# Patient Record
Sex: Female | Born: 1985
Health system: Southern US, Community
[De-identification: ages and names within clinical notes are randomized; demographics above are authoritative.]

## PROBLEM LIST (undated history)

## (undated) ENCOUNTER — Inpatient Hospital Stay (HOSPITAL_COMMUNITY): Payer: Self-pay

## (undated) DIAGNOSIS — N926 Irregular menstruation, unspecified: Secondary | ICD-10-CM

## (undated) DIAGNOSIS — D573 Sickle-cell trait: Secondary | ICD-10-CM

## (undated) DIAGNOSIS — J45909 Unspecified asthma, uncomplicated: Secondary | ICD-10-CM

## (undated) DIAGNOSIS — Z973 Presence of spectacles and contact lenses: Secondary | ICD-10-CM

## (undated) DIAGNOSIS — E611 Iron deficiency: Secondary | ICD-10-CM

## (undated) DIAGNOSIS — L309 Dermatitis, unspecified: Secondary | ICD-10-CM

## (undated) DIAGNOSIS — D649 Anemia, unspecified: Secondary | ICD-10-CM

## (undated) DIAGNOSIS — F419 Anxiety disorder, unspecified: Secondary | ICD-10-CM

## (undated) DIAGNOSIS — R002 Palpitations: Secondary | ICD-10-CM

## (undated) DIAGNOSIS — T7840XA Allergy, unspecified, initial encounter: Secondary | ICD-10-CM

## (undated) DIAGNOSIS — K5900 Constipation, unspecified: Secondary | ICD-10-CM

## (undated) DIAGNOSIS — J45998 Other asthma: Secondary | ICD-10-CM

## (undated) DIAGNOSIS — N62 Hypertrophy of breast: Secondary | ICD-10-CM

## (undated) HISTORY — DX: Dermatitis, unspecified: L30.9

## (undated) HISTORY — DX: Anemia, unspecified: D64.9

## (undated) HISTORY — PX: NO PAST SURGERIES: SHX2092

## (undated) HISTORY — DX: Allergy, unspecified, initial encounter: T78.40XA

## (undated) HISTORY — PX: BREAST SURGERY: SHX581

---
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
## 2012-05-24 DIAGNOSIS — N62 Hypertrophy of breast: Secondary | ICD-10-CM

## 2012-05-24 HISTORY — DX: Hypertrophy of breast: N62

## 2012-06-04 ENCOUNTER — Encounter (HOSPITAL_BASED_OUTPATIENT_CLINIC_OR_DEPARTMENT_OTHER): Payer: Self-pay | Admitting: *Deleted

## 2012-06-08 ENCOUNTER — Encounter (HOSPITAL_BASED_OUTPATIENT_CLINIC_OR_DEPARTMENT_OTHER): Payer: Self-pay | Admitting: Anesthesiology

## 2012-06-08 ENCOUNTER — Ambulatory Visit (HOSPITAL_BASED_OUTPATIENT_CLINIC_OR_DEPARTMENT_OTHER): Payer: BC Managed Care – PPO | Admitting: Anesthesiology

## 2012-06-08 ENCOUNTER — Encounter (HOSPITAL_BASED_OUTPATIENT_CLINIC_OR_DEPARTMENT_OTHER)
Admission: RE | Disposition: A | Discharge status: Home / Self Care | Payer: Self-pay | Source: Ambulatory Visit | Attending: Specialist

## 2012-06-08 ENCOUNTER — Ambulatory Visit (HOSPITAL_BASED_OUTPATIENT_CLINIC_OR_DEPARTMENT_OTHER)
Admission: RE | Admit: 2012-06-08 | Discharge: 2012-06-09 | Disposition: A | Discharge status: Home / Self Care | Payer: BC Managed Care – PPO | Source: Ambulatory Visit | Attending: Specialist | Admitting: Specialist

## 2012-06-08 ENCOUNTER — Encounter (HOSPITAL_BASED_OUTPATIENT_CLINIC_OR_DEPARTMENT_OTHER): Payer: Self-pay | Admitting: *Deleted

## 2012-06-08 DIAGNOSIS — N62 Hypertrophy of breast: Secondary | ICD-10-CM | POA: Insufficient documentation

## 2012-06-08 DIAGNOSIS — M549 Dorsalgia, unspecified: Secondary | ICD-10-CM | POA: Insufficient documentation

## 2012-06-08 DIAGNOSIS — Z9109 Other allergy status, other than to drugs and biological substances: Secondary | ICD-10-CM | POA: Insufficient documentation

## 2012-06-08 DIAGNOSIS — M25519 Pain in unspecified shoulder: Secondary | ICD-10-CM | POA: Insufficient documentation

## 2012-06-08 HISTORY — PX: BREAST REDUCTION SURGERY: SHX8

## 2012-06-08 HISTORY — DX: Hypertrophy of breast: N62

## 2012-06-08 HISTORY — DX: Iron deficiency: E61.1

## 2012-06-08 HISTORY — DX: Irregular menstruation, unspecified: N92.6

## 2012-06-08 LAB — POCT HEMOGLOBIN-HEMACUE: Hemoglobin: 12.6 g/dL (ref 12.0–15.0)

## 2012-06-08 SURGERY — MAMMOPLASTY, REDUCTION
Anesthesia: General | Site: Breast | Laterality: Bilateral | Wound class: Clean

## 2012-06-08 MED ORDER — LACTATED RINGERS IV SOLN
INTRAVENOUS | Status: DC
  Administered 2012-06-08 (×2): via INTRAVENOUS

## 2012-06-08 MED ORDER — LIDOCAINE HCL (CARDIAC) 20 MG/ML IV SOLN
INTRAVENOUS | Status: DC | PRN
  Administered 2012-06-08: 50 mg via INTRAVENOUS

## 2012-06-08 MED ORDER — MEPERIDINE HCL 25 MG/ML IJ SOLN: 6.2500 mg | INTRAMUSCULAR | Status: DC | PRN

## 2012-06-08 MED ORDER — LIDOCAINE-EPINEPHRINE 0.5 %-1:200000 IJ SOLN
INTRAMUSCULAR | Status: DC | PRN
  Administered 2012-06-08: 08:00:00 via INTRAMUSCULAR

## 2012-06-08 MED ORDER — DEXAMETHASONE SODIUM PHOSPHATE 4 MG/ML IJ SOLN
INTRAMUSCULAR | Status: DC | PRN
  Administered 2012-06-08: 10 mg via INTRAVENOUS

## 2012-06-08 MED ORDER — HYDROMORPHONE HCL PF 1 MG/ML IJ SOLN
0.2500 mg | INTRAMUSCULAR | Status: DC | PRN
  Administered 2012-06-08: 0.5 mg via INTRAVENOUS

## 2012-06-08 MED ORDER — ONDANSETRON HCL 4 MG/2ML IJ SOLN
INTRAMUSCULAR | Status: DC | PRN
  Administered 2012-06-08: 4 mg via INTRAVENOUS

## 2012-06-08 MED ORDER — PROPOFOL 10 MG/ML IV BOLUS
INTRAVENOUS | Status: DC | PRN
  Administered 2012-06-08: 250 mg via INTRAVENOUS

## 2012-06-08 MED ORDER — ONDANSETRON HCL 4 MG/2ML IJ SOLN
4.0000 mg | Freq: Once | INTRAMUSCULAR | Status: AC
  Administered 2012-06-08: 4 mg via INTRAVENOUS

## 2012-06-08 MED ORDER — HYDROCODONE-ACETAMINOPHEN 5-325 MG PO TABS
1.0000 | ORAL_TABLET | ORAL | Status: DC | PRN
  Administered 2012-06-08 (×2): 2 via ORAL
  Administered 2012-06-09: 1 via ORAL

## 2012-06-08 MED ORDER — ONDANSETRON HCL 4 MG/2ML IJ SOLN: 4.0000 mg | Freq: Four times a day (QID) | INTRAMUSCULAR | Status: DC

## 2012-06-08 MED ORDER — FENTANYL CITRATE 0.05 MG/ML IJ SOLN
INTRAMUSCULAR | Status: DC | PRN
  Administered 2012-06-08 (×3): 25 ug via INTRAVENOUS
  Administered 2012-06-08: 100 ug via INTRAVENOUS
  Administered 2012-06-08: 25 ug via INTRAVENOUS

## 2012-06-08 MED ORDER — CEFAZOLIN SODIUM-DEXTROSE 2-3 GM-% IV SOLR
2.0000 g | INTRAVENOUS | Status: AC
  Administered 2012-06-08: 2 g via INTRAVENOUS

## 2012-06-08 MED ORDER — SUCCINYLCHOLINE CHLORIDE 20 MG/ML IJ SOLN
INTRAMUSCULAR | Status: DC | PRN
  Administered 2012-06-08: 100 mg via INTRAVENOUS

## 2012-06-08 MED ORDER — CEFAZOLIN SODIUM 1-5 GM-% IV SOLN
1.0000 g | Freq: Three times a day (TID) | INTRAVENOUS | Status: DC
  Administered 2012-06-08 – 2012-06-09 (×2): 1 g via INTRAVENOUS

## 2012-06-08 MED ORDER — MIDAZOLAM HCL 5 MG/5ML IJ SOLN
INTRAMUSCULAR | Status: DC | PRN
  Administered 2012-06-08: 2 mg via INTRAVENOUS

## 2012-06-08 MED ORDER — DEXTROSE IN LACTATED RINGERS 5 % IV SOLN
INTRAVENOUS | Status: DC
  Administered 2012-06-08 (×2): 125 mL/h via INTRAVENOUS

## 2012-06-08 MED ORDER — MORPHINE SULFATE 2 MG/ML IJ SOLN
2.0000 mg | INTRAMUSCULAR | Status: DC | PRN
  Administered 2012-06-08: 2 mg via INTRAVENOUS

## 2012-06-08 MED ORDER — OXYCODONE HCL 5 MG/5ML PO SOLN: 5.0000 mg | Freq: Once | ORAL | Status: AC | PRN

## 2012-06-08 MED ORDER — OXYCODONE HCL 5 MG PO TABS: 5.0000 mg | ORAL_TABLET | Freq: Once | ORAL | Status: AC | PRN

## 2012-06-08 MED ORDER — PROMETHAZINE HCL 25 MG/ML IJ SOLN: 6.2500 mg | INTRAMUSCULAR | Status: DC | PRN

## 2012-06-08 SURGICAL SUPPLY — 59 items
APL SKNCLS STERI-STRIP NONHPOA (GAUZE/BANDAGES/DRESSINGS) ×2
BAG DECANTER FOR FLEXI CONT (MISCELLANEOUS) ×2 IMPLANT
BENZOIN TINCTURE PRP APPL 2/3 (GAUZE/BANDAGES/DRESSINGS) ×4 IMPLANT
BLADE KNIFE  20 PERSONNA (BLADE) ×2
BLADE KNIFE 20 PERSONNA (BLADE) ×2 IMPLANT
BLADE KNIFE PERSONA 10 (BLADE) ×2 IMPLANT
BLADE KNIFE PERSONA 15 (BLADE) ×2 IMPLANT
CANISTER SUCTION 1200CC (MISCELLANEOUS) ×2 IMPLANT
CLOTH BEACON ORANGE TIMEOUT ST (SAFETY) ×2 IMPLANT
COVER MAYO STAND STRL (DRAPES) ×2 IMPLANT
COVER TABLE BACK 60X90 (DRAPES) ×2 IMPLANT
DECANTER SPIKE VIAL GLASS SM (MISCELLANEOUS) ×4 IMPLANT
DRAIN CHANNEL 10F 3/8 F FF (DRAIN) ×4 IMPLANT
DRAPE LAPAROSCOPIC ABDOMINAL (DRAPES) ×2 IMPLANT
DRAPE UTILITY XL STRL (DRAPES) ×2 IMPLANT
DRSG PAD ABDOMINAL 8X10 ST (GAUZE/BANDAGES/DRESSINGS) ×8 IMPLANT
ELECT REM PT RETURN 9FT ADLT (ELECTROSURGICAL) ×2
ELECTRODE REM PT RTRN 9FT ADLT (ELECTROSURGICAL) ×1 IMPLANT
EVACUATOR SILICONE 100CC (DRAIN) ×4 IMPLANT
FILTER 7/8 IN (FILTER) IMPLANT
GAUZE XEROFORM 5X9 LF (GAUZE/BANDAGES/DRESSINGS) ×4 IMPLANT
GLOVE BIO SURGEON STRL SZ 6.5 (GLOVE) ×2 IMPLANT
GLOVE BIOGEL M STRL SZ7.5 (GLOVE) ×2 IMPLANT
GLOVE BIOGEL PI IND STRL 8 (GLOVE) ×1 IMPLANT
GLOVE BIOGEL PI INDICATOR 8 (GLOVE) ×1
GLOVE ECLIPSE 7.0 STRL STRAW (GLOVE) ×2 IMPLANT
GOWN PREVENTION PLUS XXLARGE (GOWN DISPOSABLE) ×4 IMPLANT
IV NS 500ML (IV SOLUTION) ×2
IV NS 500ML BAXH (IV SOLUTION) ×1 IMPLANT
NDL SPNL 18GX3.5 QUINCKE PK (NEEDLE) ×1 IMPLANT
NEEDLE SPNL 18GX3.5 QUINCKE PK (NEEDLE) ×2 IMPLANT
NS IRRIG 1000ML POUR BTL (IV SOLUTION) IMPLANT
PACK BASIN DAY SURGERY FS (CUSTOM PROCEDURE TRAY) ×2 IMPLANT
PEN SKIN MARKING BROAD TIP (MISCELLANEOUS) ×2 IMPLANT
PILLOW FOAM RUBBER ADULT (PILLOWS) ×2 IMPLANT
PIN SAFETY STERILE (MISCELLANEOUS) ×2 IMPLANT
SHEETING SILICONE GEL EPI DERM (MISCELLANEOUS) IMPLANT
SLEEVE SCD COMPRESS KNEE MED (MISCELLANEOUS) ×2 IMPLANT
SPECIMEN JAR MEDIUM (MISCELLANEOUS) IMPLANT
SPECIMEN JAR X LARGE (MISCELLANEOUS) ×4 IMPLANT
SPONGE GAUZE 4X4 12PLY (GAUZE/BANDAGES/DRESSINGS) ×4 IMPLANT
SPONGE LAP 18X18 X RAY DECT (DISPOSABLE) ×8 IMPLANT
STRIP SUTURE WOUND CLOSURE 1/2 (SUTURE) ×10 IMPLANT
SUT MNCRL AB 3-0 PS2 18 (SUTURE) ×12 IMPLANT
SUT MON AB 2-0 CT1 36 (SUTURE) IMPLANT
SUT MON AB 5-0 PS2 18 (SUTURE) ×4 IMPLANT
SUT PROLENE 3 0 PS 2 (SUTURE) ×12 IMPLANT
SYR 50ML LL SCALE MARK (SYRINGE) ×4 IMPLANT
SYR CONTROL 10ML LL (SYRINGE) IMPLANT
TAPE HYPAFIX 6X30 (GAUZE/BANDAGES/DRESSINGS) ×2 IMPLANT
TAPE MEASURE 72IN RETRACT (INSTRUMENTS)
TAPE MEASURE LINEN 72IN RETRCT (INSTRUMENTS) IMPLANT
TAPE PAPER MEDFIX 1IN X 10YD (GAUZE/BANDAGES/DRESSINGS) ×2 IMPLANT
TOWEL OR NON WOVEN STRL DISP B (DISPOSABLE) ×2 IMPLANT
TUBE CONNECTING 20X1/4 (TUBING) ×2 IMPLANT
UNDERPAD 30X30 INCONTINENT (UNDERPADS AND DIAPERS) ×6 IMPLANT
VAC PENCILS W/TUBING CLEAR (MISCELLANEOUS) ×2 IMPLANT
WATER STERILE IRR 1000ML POUR (IV SOLUTION) ×2 IMPLANT
YANKAUER SUCT BULB TIP NO VENT (SUCTIONS) ×2 IMPLANT

## 2012-06-08 NOTE — Transfer of Care (Signed)
Immediate Anesthesia Transfer of Care Note  Patient: Erin Lester  Procedure(s) Performed: Procedure(s) (LRB) with comments: MAMMARY REDUCTION  (BREAST) (Bilateral)  Patient Location: PACU  Anesthesia Type:General  Level of Consciousness: awake and alert   Airway & Oxygen Therapy: Patient Spontanous Breathing and Patient connected to face mask oxygen  Post-op Assessment: Report given to PACU RN and Post -op Vital signs reviewed and stable  Post vital signs: Reviewed and stable  Complications: No apparent anesthesia complications

## 2012-06-08 NOTE — Anesthesia Procedure Notes (Signed)
Procedure Name: Intubation Date/Time: 06/08/2012 8:02 AM Performed by: Caren Macadam Pre-anesthesia Checklist: Patient identified, Emergency Drugs available, Suction available and Patient being monitored Patient Re-evaluated:Patient Re-evaluated prior to inductionOxygen Delivery Method: Circle System Utilized Preoxygenation: Pre-oxygenation with 100% oxygen Intubation Type: IV induction Ventilation: Mask ventilation without difficulty Laryngoscope Size: Miller and 2 Grade View: Grade I Tube type: Oral Tube size: 7.0 mm Number of attempts: 1 Airway Equipment and Method: stylet and oral airway Placement Confirmation: ETT inserted through vocal cords under direct vision,  positive ETCO2 and breath sounds checked- equal and bilateral Secured at: 22 cm Tube secured with: Tape Dental Injury: Teeth and Oropharynx as per pre-operative assessment

## 2012-06-08 NOTE — Anesthesia Preprocedure Evaluation (Signed)
Anesthesia Evaluation  Patient identified by MRN, date of birth, ID band Patient awake    Reviewed: Allergy & Precautions, H&P , NPO status , Patient's Chart, lab work & pertinent test results  History of Anesthesia Complications Negative for: history of anesthetic complications  Airway Mallampati: I  Neck ROM: full    Dental No notable dental hx. (+) Teeth Intact   Pulmonary neg pulmonary ROS,  breath sounds clear to auscultation  Pulmonary exam normal       Cardiovascular negative cardio ROS  IRhythm:regular Rate:Normal     Neuro/Psych negative neurological ROS  negative psych ROS   GI/Hepatic negative GI ROS, Neg liver ROS,   Endo/Other  negative endocrine ROS  Renal/GU negative Renal ROS  negative genitourinary   Musculoskeletal   Abdominal   Peds  Hematology negative hematology ROS (+) anemia ,   Anesthesia Other Findings   Reproductive/Obstetrics negative OB ROS                           Anesthesia Physical Anesthesia Plan  ASA: I  Anesthesia Plan: General and General ETT   Post-op Pain Management:    Induction:   Airway Management Planned:   Additional Equipment:   Intra-op Plan:   Post-operative Plan:   Informed Consent: I have reviewed the patients History and Physical, chart, labs and discussed the procedure including the risks, benefits and alternatives for the proposed anesthesia with the patient or authorized representative who has indicated his/her understanding and acceptance.   Dental Advisory Given  Plan Discussed with: CRNA and Surgeon  Anesthesia Plan Comments:         Anesthesia Quick Evaluation

## 2012-06-08 NOTE — H&P (Signed)
Erin Lester is an 26 y.o. female.   Chief Complaint: Increased macromastia HPI: Increased back and shoulder pain, intertrigo  Past Medical History  Diagnosis Date  . Low iron   . Macromastia 05/2012  . Irregular menses     Past Surgical History  Procedure Date  . No past surgeries     History reviewed. No pertinent family history. Social History:  reports that she has never smoked. She has never used smokeless tobacco. She reports that she drinks alcohol. She reports that she does not use illicit drugs.  Allergies:  Allergies  Allergen Reactions  . Soap Itching    Medications Prior to Admission  Medication Sig Dispense Refill  . ferrous gluconate (FERGON) 325 MG tablet Take 325 mg by mouth 2 (two) times daily.        Results for orders placed during the hospital encounter of 06/08/12 (from the past 48 hour(s))  POCT HEMOGLOBIN-HEMACUE     Status: Normal   Collection Time   06/08/12  6:57 AM      Component Value Range Comment   Hemoglobin 12.6  12.0 - 15.0 g/dL    No results found.  Review of Systems  Constitutional: Negative.   HENT: Negative.   Eyes: Negative.   Respiratory: Negative.   Cardiovascular: Negative.   Gastrointestinal: Negative.   Musculoskeletal: Negative.   Skin: Negative.   Neurological: Negative.   Endo/Heme/Allergies: Negative.   Psychiatric/Behavioral: Negative.     Blood pressure 132/86, pulse 96, temperature 98.2 F (36.8 C), temperature source Oral, resp. rate 16, height 5\' 2"  (1.575 m), weight 84.369 kg (186 lb), last menstrual period 04/15/2012, SpO2 99.00%. Physical Exam   Assessment/Plan Severe macromastia for bilateral breast reductions  Erin Lester L 06/08/2012, 7:37 AM

## 2012-06-08 NOTE — Brief Op Note (Signed)
06/08/2012  10:34 AM  PATIENT:  Mills Koller  26 y.o. female  PRE-OPERATIVE DIAGNOSIS:  macromastia bilateral  POST-OPERATIVE DIAGNOSIS:  macromastia bilateral  PROCEDURE:  Procedure(s) (LRB) with comments: MAMMARY REDUCTION  (BREAST) (Bilateral)  SURGEON:  Surgeon(s) and Role:    * Louisa Second, MD - Primary  PHYSICIAN ASSISTANT:   ASSISTANTS: none   ANESTHESIA:   general  EBL:  Total I/O In: 1600 [I.V.:1600] Out: -   BLOOD ADMINISTERED:none  DRAINS: (right and left lateral areas) Jackson-Pratt drain(s) with closed bulb suction in the right and left chest areas   LOCAL MEDICATIONS USED:  LIDOCAINE   SPECIMEN:  Excision  DISPOSITION OF SPECIMEN:  PATHOLOGY  COUNTS:  YES  TOURNIQUET:  * No tourniquets in log *  DICTATION: .Other Dictation: Dictation Number [id number]  PLAN OF CARE: Admit for overnight observation  PATIENT DISPOSITION:  PACU - hemodynamically stable.   Delay start of Pharmacological VTE agent (>24hrs) due to surgical blood loss or risk of bleeding: yes

## 2012-06-08 NOTE — Anesthesia Postprocedure Evaluation (Signed)
  Anesthesia Post-op Note  Patient: Erin Lester  Procedure(s) Performed: Procedure(s) (LRB) with comments: MAMMARY REDUCTION  (BREAST) (Bilateral)  Patient Location: PACU  Anesthesia Type:General  Level of Consciousness: awake, alert  and oriented  Airway and Oxygen Therapy: Patient Spontanous Breathing  Post-op Pain: mild  Post-op Assessment: Post-op Vital signs reviewed  Post-op Vital Signs: Reviewed  Complications: No apparent anesthesia complications

## 2012-06-09 ENCOUNTER — Encounter (HOSPITAL_BASED_OUTPATIENT_CLINIC_OR_DEPARTMENT_OTHER): Payer: Self-pay | Admitting: Specialist

## 2012-06-09 MED ORDER — CEFAZOLIN SODIUM-DEXTROSE 2-3 GM-% IV SOLR: 2.0000 g | INTRAVENOUS | Status: DC

## 2012-06-09 NOTE — Op Note (Signed)
NAME:  Erin Lester, Erin Lester                  ACCOUNT NO.:  [id number]  MEDICAL RECORD NO.:  [id number]  LOCATION:                                 FACILITY:  PHYSICIAN:  Earvin Hansen L. Shon Hough, M.D.DATE OF BIRTH:  1986-02-07  DATE OF PROCEDURE:  06/08/2012 DATE OF DISCHARGE:                              OPERATIVE REPORT   A 25 year old with severe macromastia, back and shoulder pain secondary to large pendulous breasts, intertriginous changes by history, resistant to conservative intensive treatment, also wear special bras, has used hot packs, cold packs to relieve her pain with no improvement.  She wears an H bra cup size.  She is now being prepared for bilateral breast reductions using the inferior pedicle technique.  ANESTHESIA:  General.  Preoperatively, the patient was sat up and drawn for the surgery.  She was remarked from the nipple-areolar complex from over 30 cm to 23.  She underwent general anesthesia, intubated orally.  Prep was done to the chest and breast areas in routine fashion using Hibiclens soap and solution and walled off with sterile towels and drapes so as to make a sterile field.  0.25% Xylocaine with epinephrine 1:400,000 concentration was injected locally, 200 mL per side.  This was allowed to set up.  The wounds were scored with a #15 blade.  Skin of the inferior pedicle was de-epithelialized with #20 blade.  Medial and lateral fatty dermal pedicles were incised down to underlying pectoralis major fascia out laterally, more accessory breast tissue was removed sharply using the Bovie anticoagulation.  Irrigation was done.  After proper hemostasis, the new keyhole area was debulked and the flap was trimmed appropriately.  After proper hemostasis, the flaps were transposed and stayed with 3-0 Prolene.  Subcutaneous closure was done with 3-0 Monocryl x2 layers and 5-0 Monocryl throughout the inverted T.  The wounds were drained with a 10 fully fluted Blake drains, which  were placed in the depths of the wound, and brought out to the lateral-most portion of the incision, secured with 3-0 Prolene sutures.  At the end of the procedure, nipple-areolar complexes were examined showing good suppleness.  The skin edges were reapproximated with Steri-Strips. Dressings were applied including Xeroform, 4x4s, ABDs, Hypafix tape. She tolerated all the procedures very well.  ESTIMATED BLOOD LOSS:  150 mL.  COMPLICATIONS:  None.  She was then taken to recovery in excellent condition.     Yaakov Guthrie. Shon Hough, M.D.     Cathie Hoops  D:  06/08/2012  T:  06/08/2012  Job:  [id number]

## 2012-08-22 ENCOUNTER — Ambulatory Visit (INDEPENDENT_AMBULATORY_CARE_PROVIDER_SITE_OTHER): Payer: BC Managed Care – PPO | Admitting: Family Medicine

## 2012-08-22 VITALS — BP 131/75 | HR 88 | Temp 98.4°F | Resp 18 | Ht 62.5 in | Wt 177.0 lb

## 2012-08-22 DIAGNOSIS — R21 Rash and other nonspecific skin eruption: Secondary | ICD-10-CM

## 2012-08-22 DIAGNOSIS — L299 Pruritus, unspecified: Secondary | ICD-10-CM

## 2012-08-22 DIAGNOSIS — T148XXA Other injury of unspecified body region, initial encounter: Secondary | ICD-10-CM

## 2012-08-22 DIAGNOSIS — R238 Other skin changes: Secondary | ICD-10-CM

## 2012-08-22 MED ORDER — METHYLPREDNISOLONE 4 MG PO KIT: PACK | ORAL | Status: DC

## 2012-08-22 MED ORDER — HYDROXYZINE HCL 10 MG PO TABS: 10.0000 mg | ORAL_TABLET | Freq: Two times a day (BID) | ORAL | Status: DC | PRN

## 2012-08-22 NOTE — Progress Notes (Signed)
[facility]:  Office Visit  Chief Complaint:  Chief Complaint  Patient presents with  . Rash    all over x 1 week    HPI: Erin Lester is a 27 y.o. female who complains of  1 week history of rash on left ring finger after wearing a new ring which she thinks may have had nickel in it, the rash then appeared on her  toes but it was not just an itchy rash, it was a blistering rash ( big blisters filled with fluid) and then it spread on her feet on the lateral borders,  up arm and legs. Denies any recent/ new travels, no new meds, no new foods, no new detergents. Tried OTC cortisone but did not feel any better. No pets, no insect bites. Fiance lives with her and does not have the rash. She tried soaking her feet which did not help. She does wash her hand frequently and uses hand sanitizer, she works in [employer]. No fevers, chills. Numbness/tingling/SOB/CP. Denies STDs or mouth ulcers.   Past Medical History  Diagnosis Date  . Low iron   . Macromastia 05/2012  . Irregular menses   . Allergy   . Anemia    Past Surgical History  Procedure Laterality Date  . No past surgeries    . Breast reduction surgery  06/08/2012    Procedure: MAMMARY REDUCTION  (BREAST);  Surgeon: Louisa Second, MD;  Location: [facility];  Service: Plastics;  Laterality: Bilateral;  . Breast surgery     History   Social History  . Marital Status: Single    Spouse Name: N/A    Number of Children: N/A  . Years of Education: N/A   Social History Main Topics  . Smoking status: Never Smoker   . Smokeless tobacco: Never Used  . Alcohol Use: Yes     Comment: seldom  . Drug Use: No  . Sexually Active: Yes    Birth Control/ Protection: Condom   Other Topics Concern  . None   Social History Narrative  . None   History reviewed. No pertinent family history. Allergies  Allergen Reactions  . Soap Itching   Prior to Admission medications   Medication Sig Start Date  End Date Taking? Authorizing Provider  betamethasone dipropionate (DIPROLENE) 0.05 % cream Apply topically 2 (two) times daily.   Yes Historical Provider, MD  ferrous gluconate (FERGON) 325 MG tablet Take 325 mg by mouth 2 (two) times daily.    Historical Provider, MD     ROS: The patient denies fevers, chills, night sweats, unintentional weight loss, chest pain, palpitations, wheezing, dyspnea on exertion, nausea, vomiting, abdominal pain, dysuria, hematuria, melena, numbness, weakness, or tingling.   All other systems have been reviewed and were otherwise negative with the exception of those mentioned in the HPI and as above.    PHYSICAL EXAM: Filed Vitals:   08/22/12 1739  BP: 131/75  Pulse: 88  Temp: 98.4 F (36.9 C)  Resp: 18   Filed Vitals:   08/22/12 1739  Height: 5' 2.5" (1.588 m)  Weight: 177 lb (80.287 kg)   Body mass index is 31.84 kg/(m^2).  General: Alert, no acute distress HEENT:  Normocephalic, atraumatic, oropharynx patent.  Cardiovascular:  Regular rate and rhythm, no rubs murmurs or gallops.  No Carotid bruits, radial pulse intact. No pedal edema.  Respiratory: Clear to auscultation bilaterally.  No wheezes, rales, or rhonchi.  No cyanosis, no use of accessory musculature  GI: No organomegaly, abdomen is soft and non-tender, positive bowel sounds.  No masses. Skin: + urticarial like rash on left ring finger, + various stages of blisters that have popped along bilateral feet on edges of foot and also on left toe.  Neurologic: Facial musculature symmetric. Psychiatric: Patient is appropriate throughout our interaction. Lymphatic: No cervical lymphadenopathy Musculoskeletal: Gait intact.   LABS: Results for orders placed during the hospital encounter of 06/08/12  POCT HEMOGLOBIN-HEMACUE      Result Value Range   Hemoglobin 12.6  12.0 - 15.0 g/dL     EKG/XRAY:   Primary read interpreted by Dr. Conley Rolls at [facility].   ASSESSMENT/PLAN: Encounter Diagnoses  Name  Primary?  . Rash and nonspecific skin eruption Yes  . Blisters of multiple sites   . Itch    This may be just contact dermatitis but I am not sure of source She does not fit the normal age distribution for Bullous pemphigoid, so highly unlikely but that is what the blisters on her feet look like before they burst Rx Medrol dose pack and also Hydroxyzine F/u prn or in 1 week by phone if no better, refer to dermatology    Rockne Coons, DO 08/22/2012 6:51 PM

## 2012-10-16 ENCOUNTER — Other Ambulatory Visit: Payer: Self-pay

## 2012-10-16 ENCOUNTER — Ambulatory Visit (INDEPENDENT_AMBULATORY_CARE_PROVIDER_SITE_OTHER): Payer: BC Managed Care – PPO | Admitting: Family Medicine

## 2012-10-16 VITALS — BP 117/76 | HR 90 | Temp 98.5°F | Resp 16 | Ht 62.5 in | Wt 178.0 lb

## 2012-10-16 DIAGNOSIS — H1013 Acute atopic conjunctivitis, bilateral: Secondary | ICD-10-CM

## 2012-10-16 DIAGNOSIS — J309 Allergic rhinitis, unspecified: Secondary | ICD-10-CM

## 2012-10-16 DIAGNOSIS — J9801 Acute bronchospasm: Secondary | ICD-10-CM

## 2012-10-16 DIAGNOSIS — H1045 Other chronic allergic conjunctivitis: Secondary | ICD-10-CM

## 2012-10-16 MED ORDER — MOMETASONE FUROATE 50 MCG/ACT NA SUSP: 2.0000 | Freq: Every day | NASAL | Status: DC

## 2012-10-16 MED ORDER — ALBUTEROL SULFATE HFA 108 (90 BASE) MCG/ACT IN AERS: 2.0000 | INHALATION_SPRAY | Freq: Four times a day (QID) | RESPIRATORY_TRACT | Status: DC | PRN

## 2012-10-16 MED ORDER — AZELASTINE HCL 0.05 % OP SOLN: 1.0000 [drp] | Freq: Two times a day (BID) | OPHTHALMIC | Status: DC

## 2012-10-16 NOTE — Progress Notes (Signed)
Subjective:    Patient ID: Erin Lester, female    DOB: Feb 25, 1986, 27 y.o.   MRN: [id number]  HPI Erin Lester is a 27 y.o. female  Allergy sx's - past month.  Hx of allergic rhinitis.  Has had wheeze with increased allergies in past.  Last used inhaler years ago. Congestion and runny nose, itchy eyes at night, congestion and cough at night. Wheeze at night at times only with coughing fits.   Tx: benadryl, allegra, other otc antihistamines without relief. Allegra past few days.   Past Medical History  Diagnosis Date  . Low iron   . Macromastia 05/2012  . Irregular menses   . Allergy   . Anemia    Past Surgical History  Procedure Laterality Date  . No past surgeries    . Breast reduction surgery  06/08/2012    Procedure: MAMMARY REDUCTION  (BREAST);  Surgeon: Louisa Second, MD;  Location: [facility];  Service: Plastics;  Laterality: Bilateral;  . Breast surgery     History   Social History Narrative  . No narrative on file    Review of Systems  Constitutional: Negative for fever and chills.  HENT: Positive for congestion, rhinorrhea and sneezing.   Eyes: Positive for redness and itching. Negative for visual disturbance.  Respiratory: Positive for cough and wheezing. Negative for shortness of breath.       Objective:   Physical Exam  Vitals reviewed. Constitutional: She is oriented to person, place, and time. She appears well-developed and well-nourished. No distress.  HENT:  Head: Normocephalic and atraumatic.    Right Ear: Hearing, tympanic membrane, external ear and ear canal normal.  Left Ear: Hearing, tympanic membrane, external ear and ear canal normal.  Nose: Mucosal edema and rhinorrhea present. No nasal deformity or nasal septal hematoma. No epistaxis. Right sinus exhibits no maxillary sinus tenderness and no frontal sinus tenderness. Left sinus exhibits no maxillary sinus tenderness and no frontal sinus tenderness.  Mouth/Throat: Oropharynx  is clear and moist. No oropharyngeal exudate.  Eyes: Conjunctivae and EOM are normal. Pupils are equal, round, and reactive to light.  Cardiovascular: Normal rate, regular rhythm, normal heart sounds and intact distal pulses.   No murmur heard. Pulmonary/Chest: Effort normal and breath sounds normal. No respiratory distress. She has no wheezes. She has no rhonchi.  Neurological: She is alert and oriented to person, place, and time.  Skin: Skin is warm and dry. No rash noted.  Psychiatric: She has a normal mood and affect. Her behavior is normal.       Assessment & Plan:  Erin Lester is a 27 y.o. female Allergic rhinitis - Plan: mometasone (NASONEX) 50 MCG/ACT nasal spray  Bronchospasm - Plan: albuterol (PROVENTIL HFA;VENTOLIN HFA) 108 (90 BASE) MCG/ACT inhaler  Allergic conjunctivitis, bilateral - Plan: azelastine (OPTIVAR) 0.05 % ophthalmic solution  Recurrent allergic rhinitis with possible intermittent bronchospasm.  Add nasonex and optivar to allegra qd, allergen avoidance techniques discussed, proair only if needed for wheezing and instructed on need for follow up if recurrent use of this or any worsening.    Meds ordered this encounter  Medications  . mometasone (NASONEX) 50 MCG/ACT nasal spray    Sig: Place 2 sprays into the nose daily.    Dispense:  17 g    Refill:  2  . azelastine (OPTIVAR) 0.05 % ophthalmic solution    Sig: Place 1 drop into both eyes 2 (two) times daily.    Dispense:  6 mL  Refill:  3  . albuterol (PROVENTIL HFA;VENTOLIN HFA) 108 (90 BASE) MCG/ACT inhaler    Sig: Inhale 2 puffs into the lungs every 6 (six) hours as needed for wheezing.    Dispense:  1 Inhaler    Refill:  0   Patient Instructions  Continue allegra, add nasonex nasal spray - 2 sprays in each nostril each day. optivar drops if needed. Albuterol only if needed for wheezing - if using this frequently or any worsening of your symptoms - recheck here or your primary provider.  Allergic  Rhinitis Allergic rhinitis is when the mucous membranes in the nose respond to allergens. Allergens are particles in the air that cause your body to have an allergic reaction. This causes you to release allergic antibodies. Through a chain of events, these eventually cause you to release histamine into the blood stream (hence the use of antihistamines). Although meant to be protective to the body, it is this release that causes your discomfort, such as frequent sneezing, congestion and an itchy runny nose.  CAUSES  The pollen allergens may come from grasses, trees, and weeds. This is seasonal allergic rhinitis, or "hay fever." Other allergens cause year-round allergic rhinitis (perennial allergic rhinitis) such as house dust mite allergen, pet dander and mold spores.  SYMPTOMS   Nasal stuffiness (congestion).  Runny, itchy nose with sneezing and tearing of the eyes.  There is often an itching of the mouth, eyes and ears. It cannot be cured, but it can be controlled with medications. DIAGNOSIS  If you are unable to determine the offending allergen, skin or blood testing may find it. TREATMENT   Avoid the allergen.  Medications and allergy shots (immunotherapy) can help.  Hay fever may often be treated with antihistamines in pill or nasal spray forms. Antihistamines block the effects of histamine. There are over-the-counter medicines that may help with nasal congestion and swelling around the eyes. Check with your caregiver before taking or giving this medicine. If the treatment above does not work, there are many new medications your caregiver can prescribe. Stronger medications may be used if initial measures are ineffective. Desensitizing injections can be used if medications and avoidance fails. Desensitization is when a patient is given ongoing shots until the body becomes less sensitive to the allergen. Make sure you follow up with your caregiver if problems continue. SEEK MEDICAL CARE IF:     You develop fever (more than 100.5 F (38.1 C).  You develop a cough that does not stop easily (persistent).  You have shortness of breath.  You start wheezing.  Symptoms interfere with normal daily activities. Document Released: 03/05/2001 Document Revised: 09/02/2011 Document Reviewed: 09/14/2008 [facility] Patient Information 2013 [place], [place].

## 2012-10-16 NOTE — Patient Instructions (Signed)
Continue allegra, add nasonex nasal spray - 2 sprays in each nostril each day. optivar drops if needed. Albuterol only if needed for wheezing - if using this frequently or any worsening of your symptoms - recheck here or your primary provider.  Allergic Rhinitis Allergic rhinitis is when the mucous membranes in the nose respond to allergens. Allergens are particles in the air that cause your body to have an allergic reaction. This causes you to release allergic antibodies. Through a chain of events, these eventually cause you to release histamine into the blood stream (hence the use of antihistamines). Although meant to be protective to the body, it is this release that causes your discomfort, such as frequent sneezing, congestion and an itchy runny nose.  CAUSES  The pollen allergens may come from grasses, trees, and weeds. This is seasonal allergic rhinitis, or "hay fever." Other allergens cause year-round allergic rhinitis (perennial allergic rhinitis) such as house dust mite allergen, pet dander and mold spores.  SYMPTOMS   Nasal stuffiness (congestion).  Runny, itchy nose with sneezing and tearing of the eyes.  There is often an itching of the mouth, eyes and ears. It cannot be cured, but it can be controlled with medications. DIAGNOSIS  If you are unable to determine the offending allergen, skin or blood testing may find it. TREATMENT   Avoid the allergen.  Medications and allergy shots (immunotherapy) can help.  Hay fever may often be treated with antihistamines in pill or nasal spray forms. Antihistamines block the effects of histamine. There are over-the-counter medicines that may help with nasal congestion and swelling around the eyes. Check with your caregiver before taking or giving this medicine. If the treatment above does not work, there are many new medications your caregiver can prescribe. Stronger medications may be used if initial measures are ineffective. Desensitizing  injections can be used if medications and avoidance fails. Desensitization is when a patient is given ongoing shots until the body becomes less sensitive to the allergen. Make sure you follow up with your caregiver if problems continue. SEEK MEDICAL CARE IF:   You develop fever (more than 100.5 F (38.1 C).  You develop a cough that does not stop easily (persistent).  You have shortness of breath.  You start wheezing.  Symptoms interfere with normal daily activities. Document Released: 03/05/2001 Document Revised: 09/02/2011 Document Reviewed: 09/14/2008 [facility] Patient Information 2013 [place], [place].

## 2015-01-27 ENCOUNTER — Ambulatory Visit (INDEPENDENT_AMBULATORY_CARE_PROVIDER_SITE_OTHER): Payer: Commercial Managed Care - HMO | Admitting: Emergency Medicine

## 2015-01-27 ENCOUNTER — Ambulatory Visit (INDEPENDENT_AMBULATORY_CARE_PROVIDER_SITE_OTHER): Payer: Commercial Managed Care - HMO

## 2015-01-27 ENCOUNTER — Ambulatory Visit: Payer: Self-pay

## 2015-01-27 VITALS — BP 132/76 | HR 98 | Temp 99.0°F | Resp 18 | Ht 63.5 in | Wt 172.8 lb

## 2015-01-27 DIAGNOSIS — N912 Amenorrhea, unspecified: Secondary | ICD-10-CM

## 2015-01-27 DIAGNOSIS — M79672 Pain in left foot: Secondary | ICD-10-CM

## 2015-01-27 LAB — POCT URINE PREGNANCY: PREG TEST UR: NEGATIVE

## 2015-01-27 NOTE — Progress Notes (Signed)
This chart was scribed for Lesle Chris, MD by Andrew Au, ED Scribe. This patient was seen in room 1 and the patient's care was started at 6:33 PM  Chief Complaint:  Chief Complaint  Patient presents with  . Foot Pain    left foot, started wednesday     HPI: Erin Lester is a 29 y.o. female who reports to [facility] today complaining of left medial foot pain that began 4 days ago. Pt states while wearing heels she inverted left foot. 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 Pt is not on birth control. Pt has been married for 1 year and denies using contraceptives during intercourse. LMP- 01/10/2015.  Past Medical History  Diagnosis Date  . Low iron   . Macromastia 05/2012  . Irregular menses   . Allergy   . Anemia    Past Surgical History  Procedure Laterality Date  . No past surgeries    . Breast reduction surgery  06/08/2012    Procedure: MAMMARY REDUCTION  (BREAST);  Surgeon: Louisa Second, MD;  Location: [facility];  Service: Plastics;  Laterality: Bilateral;  . Breast surgery     History   Social History  . Marital Status: Single    Spouse Name: N/A  . Number of Children: N/A  . Years of Education: N/A   Social History Main Topics  . Smoking status: Never Smoker   . Smokeless tobacco: Never Used  . Alcohol Use: Yes     Comment: seldom  . Drug Use: No  . Sexual Activity: Yes    Birth Control/ Protection: Condom   Other Topics Concern  . None   Social History Narrative   History reviewed. No pertinent family history. Allergies  Allergen Reactions  . Soap Itching   Prior to Admission medications   Medication Sig Start Date End Date Taking? Authorizing Provider  azelastine (OPTIVAR) 0.05 % ophthalmic solution Place 1 drop into both eyes 2 (two) times daily. 10/16/12  Yes Shade Flood, MD  albuterol (PROVENTIL HFA;VENTOLIN HFA) 108 (90 BASE) MCG/ACT inhaler Inhale 2 puffs into the lungs every 6  (six) hours as needed for wheezing. Patient not taking: Reported on 01/27/2015 10/16/12   Shade Flood, MD  betamethasone dipropionate (DIPROLENE) 0.05 % cream Apply topically 2 (two) times daily.    Historical Provider, MD  ferrous gluconate (FERGON) 325 MG tablet Take 325 mg by mouth 2 (two) times daily.    Historical Provider, MD  hydrOXYzine (ATARAX/VISTARIL) 10 MG tablet Take 1 tablet (10 mg total) by mouth 2 (two) times daily as needed for itching. 08/22/12   Thao P Le, DO  methylPREDNISolone (MEDROL, PAK,) 4 MG tablet follow package directions Patient not taking: Reported on 01/27/2015 08/22/12   Thao P Le, DO  mometasone (NASONEX) 50 MCG/ACT nasal spray Place 2 sprays into the nose daily. 10/16/12   Shade Flood, MD     ROS: The patient denies fevers, chills, night sweats, unintentional weight loss, chest pain, palpitations, wheezing, dyspnea on exertion, nausea, vomiting, abdominal pain, dysuria, hematuria, melena, numbness, weakness, or tingling.   All other systems have been reviewed and were otherwise negative with the exception of those mentioned in the HPI and as above.    PHYSICAL EXAM: Filed Vitals:   01/27/15 1801  BP: 132/76  Pulse: 98  Temp: 99 F (37.2 C)  Resp: 18   Body mass index is 30.13 kg/(m^2).  General: Alert, no acute distress HEENT:  Normocephalic, atraumatic, oropharynx patent. Eye: [name] [facility] Cardiovascular:  Regular rate and rhythm, no rubs murmurs or gallops.  No Carotid bruits, radial pulse intact. No pedal edema.  Respiratory: Clear to auscultation bilaterally.  No wheezes, rales, or rhonchi.  No cyanosis, no use of accessory musculature Abdominal: No organomegaly, abdomen is soft and non-tender, positive bowel sounds.  No masses. Musculoskeletal: Gait intact. Tender over medial arch approximately 2 inch from medial malleolus. Skin: No rashes. Neurologic: Facial musculature symmetric. Psychiatric: Patient acts appropriately throughout our  interaction. Lymphatic: No cervical or submandibular lymphadenopathy Genitourinary/Anorectal: No acute findings  LABS: Results for orders placed or performed in visit on 01/27/15  POCT urine pregnancy  Result Value Ref Range   Preg Test, Ur Negative Negative   EKG/XRAY:   Primary read interpreted by Dr. Cleta Alberts at [facility]. There appears to be a secondary ossification center versus injury of the navicular bone. This is the area of tenderness on exam.   ASSESSMENT/PLAN: Patient does have an abnormal appearing navicular bone. X-rays sent to radiology for their opinion. Referral made to orthopedics for their evaluation.I personally performed the services described in this documentation, which was scribed in my presence. The recorded information has been reviewed and is accurate.  Earl Lites, MD   Gross sideeffects, risk and benefits, and alternatives of medications d/w patient. Patient is aware that all medications have potential sideeffects and we are unable to predict every sideeffect or drug-drug interaction that may occur.  Lesle Chris MD 01/27/2015 6:30 PM

## 2015-08-04 ENCOUNTER — Ambulatory Visit (INDEPENDENT_AMBULATORY_CARE_PROVIDER_SITE_OTHER): Payer: Self-pay | Admitting: Family Medicine

## 2015-08-04 ENCOUNTER — Inpatient Hospital Stay (HOSPITAL_COMMUNITY)
Admission: AD | Admit: 2015-08-04 | Discharge: 2015-08-04 | Disposition: A | Discharge status: Home / Self Care | Payer: Medicaid Other | Source: Ambulatory Visit | Attending: Family Medicine | Admitting: Family Medicine

## 2015-08-04 ENCOUNTER — Encounter (HOSPITAL_COMMUNITY): Payer: Self-pay | Admitting: *Deleted

## 2015-08-04 VITALS — BP 114/68 | HR 104 | Temp 100.1°F | Resp 16 | Ht 63.0 in | Wt 188.0 lb

## 2015-08-04 DIAGNOSIS — Z3491 Encounter for supervision of normal pregnancy, unspecified, first trimester: Secondary | ICD-10-CM | POA: Diagnosis present

## 2015-08-04 DIAGNOSIS — N949 Unspecified condition associated with female genital organs and menstrual cycle: Secondary | ICD-10-CM

## 2015-08-04 DIAGNOSIS — R102 Pelvic and perineal pain: Secondary | ICD-10-CM

## 2015-08-04 DIAGNOSIS — Z3A10 10 weeks gestation of pregnancy: Secondary | ICD-10-CM | POA: Diagnosis not present

## 2015-08-04 DIAGNOSIS — Z3201 Encounter for pregnancy test, result positive: Secondary | ICD-10-CM

## 2015-08-04 DIAGNOSIS — N926 Irregular menstruation, unspecified: Secondary | ICD-10-CM

## 2015-08-04 DIAGNOSIS — N912 Amenorrhea, unspecified: Secondary | ICD-10-CM

## 2015-08-04 DIAGNOSIS — M545 Low back pain: Secondary | ICD-10-CM

## 2015-08-04 LAB — POCT URINALYSIS DIP (MANUAL ENTRY)
BILIRUBIN UA: NEGATIVE
GLUCOSE UA: NEGATIVE
Nitrite, UA: NEGATIVE
Protein Ur, POC: 30 — AB
SPEC GRAV UA: 1.02
Urobilinogen, UA: 0.2
pH, UA: 5.5

## 2015-08-04 LAB — POCT URINE PREGNANCY: Preg Test, Ur: POSITIVE — AB

## 2015-08-04 LAB — POC MICROSCOPIC URINALYSIS (UMFC)

## 2015-08-04 NOTE — Addendum Note (Signed)
Addended by: Isaac Bliss on: 08/04/2015 09:08 AM   Modules accepted: Kipp Brood

## 2015-08-04 NOTE — Progress Notes (Signed)
Subjective:    Patient ID: Erin Lester, female    DOB: [birth year], 30 y.o.   MRN: [id number]  08/04/2015  Routine Prenatal Visit and Back Pain   HPI This 30 y.o. female presents for evaluation of R side pain. With laying down or sitting on couch, has lower back pain and back pops. With moving around or walking, pain free.  Last night, pain radiated into R anterior thigh; can radiate into groin region.  No n/t/b in leg.  No nighttime awakening.  Lays on R side.  Pain worsens with laying on R side.  If lays on back, pain goes away.  Normal b/b function.  No saddle paresthesias. No medication. Severity 5/10.  Onset two days ago.  No heavy lifting; no unusual activity or exercise.  Unemployed.  No dysuria, hematuria, +frequency; nocturia x 2-3 last night; usually no nocturia.  LMP 05-21-15 normal.  History of irregular menses chronic.  Sexually active; married.  No vaginal discharge; no vaginal itching or odor.  +nauseated onset 2 weeks ago; +vomiting this morning when brushing teeth.  No diarrhea.  Vomiting once per day early in the morning.  No constipation.  Was trying to get pregnant.  Did pregnancy test again three days ago; tested three times and all positive.   No PNV.  Review of Systems  Constitutional: Positive for diaphoresis. Negative for fever, chills and fatigue.  HENT: Negative for congestion, ear pain and sore throat.   Eyes: Negative for visual disturbance.  Respiratory: Negative for cough and shortness of breath.   Cardiovascular: Negative for chest pain, palpitations and leg swelling.  Gastrointestinal: Positive for nausea and vomiting. Negative for abdominal pain, diarrhea and constipation.  Endocrine: Negative for cold intolerance, heat intolerance, polydipsia, polyphagia and polyuria.  Genitourinary: Positive for menstrual problem and pelvic pain. Negative for dysuria, urgency, frequency, hematuria, flank pain, decreased urine volume, vaginal bleeding, vaginal discharge,  genital sores and vaginal pain.  Musculoskeletal: Positive for back pain.  Neurological: Negative for dizziness, tremors, seizures, syncope, facial asymmetry, speech difficulty, weakness, light-headedness, numbness and headaches.    Past Medical History  Diagnosis Date  . Low iron   . Macromastia 05/2012  . Irregular menses   . Allergy   . Anemia    Past Surgical History  Procedure Laterality Date  . No past surgeries    . Breast reduction surgery  06/08/2012    Procedure: MAMMARY REDUCTION  (BREAST);  Surgeon: Louisa Second, MD;  [facility];  Service: Plastics;  Laterality: Bilateral;  . Breast surgery     Allergies  Allergen Reactions  . Soap Itching   Current Outpatient Prescriptions  Medication Sig Dispense Refill  . albuterol (PROVENTIL HFA;VENTOLIN HFA) 108 (90 BASE) MCG/ACT inhaler Inhale 2 puffs into the lungs every 6 (six) hours as needed for wheezing. (Patient not taking: Reported on 01/27/2015) 1 Inhaler 0  . azelastine (OPTIVAR) 0.05 % ophthalmic solution Place 1 drop into both eyes 2 (two) times daily. (Patient not taking: Reported on 08/04/2015) 6 mL 3  . betamethasone dipropionate (DIPROLENE) 0.05 % cream Apply topically 2 (two) times daily. Reported on 08/04/2015    . ferrous gluconate (FERGON) 325 MG tablet Take 325 mg by mouth 2 (two) times daily. Reported on 08/04/2015    . hydrOXYzine (ATARAX/VISTARIL) 10 MG tablet Take 1 tablet (10 mg total) by mouth 2 (two) times daily as needed for itching. (Patient not taking: Reported on 08/04/2015) 30 tablet 0  . methylPREDNISolone (MEDROL, PAK,)  4 MG tablet follow package directions (Patient not taking: Reported on 01/27/2015) 21 tablet 0  . mometasone (NASONEX) 50 MCG/ACT nasal spray Place 2 sprays into the nose daily. (Patient not taking: Reported on 08/04/2015) 17 g 2   No current facility-administered medications for this visit.   Social History   Social History  . Marital Status: Single     Spouse Name: N/A  . Number of Children: N/A  . Years of Education: N/A   Occupational History  . Not on file.   Social History Main Topics  . Smoking status: Never Smoker   . Smokeless tobacco: Never Used  . Alcohol Use: Yes     Comment: seldom  . Drug Use: No  . Sexual Activity: Yes    Birth Control/ Protection: Condom, None   Other Topics Concern  . Not on file   Social History Narrative   History reviewed. No pertinent family history.     Objective:    BP 114/68 mmHg  Pulse 104  Temp(Src) 100.1 F (37.8 C)  Resp 16  Ht  (1.6 m)  Wt 188 lb (85.276 kg)  BMI 33.31 kg/m2  SpO2 99%  LMP 05/21/2015 Physical Exam  Constitutional: She is oriented to person, place, and time. She appears well-developed and well-nourished. No distress.  HENT:  Head: Normocephalic and atraumatic.  Right Ear: External ear normal.  Left Ear: External ear normal.  Nose: Nose normal.  Mouth/Throat: Oropharynx is clear and moist.  Eyes: Conjunctivae and EOM are normal. Pupils are equal, round, and reactive to light.  Neck: Normal range of motion. Neck supple. Carotid bruit is not present. No thyromegaly present.  Cardiovascular: Normal rate, regular rhythm, normal heart sounds and intact distal pulses.  Exam reveals no gallop and no friction rub.   No murmur heard. Pulmonary/Chest: Effort normal and breath sounds normal. She has no wheezes. She has no rales.  Abdominal: Soft. Bowel sounds are normal. She exhibits no distension and no mass. There is no tenderness. There is no rebound and no guarding.  Genitourinary: Uterus normal. There is no rash, tenderness or lesion on the right labia. There is no rash, tenderness or lesion on the left labia. Cervix exhibits no motion tenderness and no friability. Right adnexum displays no mass, no tenderness and no fullness. Left adnexum displays no mass, no tenderness and no fullness. Vaginal discharge found.  Scant white thin vaginal discharge in vault.   Musculoskeletal: She exhibits tenderness.       Lumbar back: She exhibits tenderness and pain. She exhibits normal range of motion, no bony tenderness and no spasm.       Back:  Lumbar spine:  Non-tender midline; +tender paraspinal regions R at SI region.  Straight leg raises negative B; toe and heel walking intact; marching intact; motor 5/5 BLE.  Full ROM lumbar spine without limitation.  Pain with flexion.   Lymphadenopathy:    She has no cervical adenopathy.  Neurological: She is alert and oriented to person, place, and time. No cranial nerve deficit.  Skin: Skin is warm and dry. No rash noted. She is not diaphoretic. No erythema. No pallor.  Psychiatric: She has a normal mood and affect. Her behavior is normal.   Results for orders placed or performed in visit on 08/04/15  POCT urinalysis dipstick  Result Value Ref Range   Color, UA yellow yellow   Clarity, UA clear clear   Glucose, UA negative negative   Bilirubin, UA small (A) negative  Ketones, POC UA negative negative   Spec Grav, UA 1.020    Blood, UA trace-intact (A) negative   pH, UA 5.5    Protein Ur, POC =30 (A) negative   Urobilinogen, UA 0.2    Nitrite, UA Negative Negative   Leukocytes, UA Trace (A) Negative  POCT urine pregnancy  Result Value Ref Range   Preg Test, Ur Positive (A) Negative  POCT Microscopic Urinalysis ([facility])  Result Value Ref Range   WBC,UR,HPF,POC Few (A) None WBC/hpf   RBC,UR,HPF,POC None None RBC/hpf   Bacteria Few (A) None, Too numerous to count   Mucus Present (A) Absent   Epithelial Cells, UR Per Microscopy Few (A) None, Too numerous to count cells/hpf       Assessment & Plan:   1. Low back pain, unspecified back pain laterality, with sciatica presence unspecified   2. Missed periods   3. Pelvic cramping   4. Irregular menses   5. Positive pregnancy test    _New. -Send urine culture and GC/Chlam. -patient very anxious about possible miscarriage; thus, desires evaluation at  MAU with quant HCG and ultrasound.   -Benign pelvic exam in office. -lower back pain consistent with musculoskeletal etiology yet also having pelvic cramping that appears separate from lower back pain.  Orders Placed This Encounter  Procedures  . Urine culture  . GC/Chlamydia Probe Amp  . POCT urinalysis dipstick  . POCT urine pregnancy  . POCT Microscopic Urinalysis ([facility])   No orders of the defined types were placed in this encounter.    No Follow-up on file.    Dericka Ostenson Paulita Fujita, M.D. [facility] [address] [place], [place]  [address] [phone] phone [phone] fax

## 2015-08-04 NOTE — MAU Provider Note (Signed)
History     CSN: [id number]  Arrival date and time: 08/04/15 4098   None     No chief complaint on file.  HPI Erin Lester 30 y.o. G1P0  presents to MAU for an ultrasound to see how far along she is.  She was seen at urgent care earlier today for a positive pregnancy test.  She was told she could come here for an u/s.  She denies abdominal pain or vaginal bleeding.  She has no other concerns.      OB History    Gravida Para Term Preterm AB TAB SAB Ectopic Multiple Living   1               Past Medical History  Diagnosis Date  . Low iron   . Macromastia 05/2012  . Irregular menses   . Allergy   . Anemia     Past Surgical History  Procedure Laterality Date  . No past surgeries    . Breast reduction surgery  06/08/2012    Procedure: MAMMARY REDUCTION  (BREAST);  Surgeon: Louisa Second, MD;  Location: [facility];  Service: Plastics;  Laterality: Bilateral;  . Breast surgery      No family history on file.  Social History  Substance Use Topics  . Smoking status: Never Smoker   . Smokeless tobacco: Never Used  . Alcohol Use: Yes     Comment: seldom    Allergies:  Allergies  Allergen Reactions  . Soap Itching    Prescriptions prior to admission  Medication Sig Dispense Refill Last Dose  . albuterol (PROVENTIL HFA;VENTOLIN HFA) 108 (90 BASE) MCG/ACT inhaler Inhale 2 puffs into the lungs every 6 (six) hours as needed for wheezing. (Patient not taking: Reported on 01/27/2015) 1 Inhaler 0 Not Taking  . azelastine (OPTIVAR) 0.05 % ophthalmic solution Place 1 drop into both eyes 2 (two) times daily. (Patient not taking: Reported on 08/04/2015) 6 mL 3 Not Taking  . betamethasone dipropionate (DIPROLENE) 0.05 % cream Apply topically 2 (two) times daily. Reported on 08/04/2015   Not Taking  . ferrous gluconate (FERGON) 325 MG tablet Take 325 mg by mouth 2 (two) times daily. Reported on 08/04/2015   Not Taking  . hydrOXYzine (ATARAX/VISTARIL) 10 MG  tablet Take 1 tablet (10 mg total) by mouth 2 (two) times daily as needed for itching. (Patient not taking: Reported on 08/04/2015) 30 tablet 0 Not Taking  . methylPREDNISolone (MEDROL, PAK,) 4 MG tablet follow package directions (Patient not taking: Reported on 01/27/2015) 21 tablet 0 Not Taking  . mometasone (NASONEX) 50 MCG/ACT nasal spray Place 2 sprays into the nose daily. (Patient not taking: Reported on 08/04/2015) 17 g 2 Not Taking    ROS Pertinent ROS in HPI.  All other systems are negative.   Physical Exam   Blood pressure 128/79, pulse 94, temperature 99 F (37.2 C), resp. rate 18, last menstrual period 05/21/2015.  Physical Exam  Constitutional: She is oriented to person, place, and time. She appears well-developed and well-nourished. No distress.  Eyes: Conjunctivae and EOM are normal.  Neurological: She is alert and oriented to person, place, and time.  Psychiatric: She has a normal mood and affect. Her behavior is normal.    MAU Course  Procedures  MDM No problems identified.  Pregnancy test positive on Epic  Assessment and Plan  A: pregnancy  P: Discharge to home Pregnancy verification letter provided List of OB providers given List of safe OTC meds in  pregnancy given Pt advised to use OTC PNV Bleeding precautions Patient may return to MAU as needed or if her condition were to change or worsen   Bertram Denver 08/04/2015, 9:43 AM

## 2015-08-04 NOTE — MAU Note (Signed)
Pt presents to MAU stating that she went to urgent care today to find out how far pregnant she is. Pt states mild pain in her lower back when she lays down. Denies any vaginal bleeding or abnormal discharge

## 2015-08-04 NOTE — Patient Instructions (Signed)
1. Present to Maternity Admissions at [facility] for further evaluation.

## 2015-08-05 LAB — URINE CULTURE
Colony Count: NO GROWTH
ORGANISM ID, BACTERIA: NO GROWTH

## 2015-08-05 LAB — GC/CHLAMYDIA PROBE AMP
CT PROBE, AMP APTIMA: NOT DETECTED
GC Probe RNA: NOT DETECTED

## 2015-08-29 ENCOUNTER — Encounter: Payer: Self-pay | Admitting: Certified Nurse Midwife

## 2015-09-26 ENCOUNTER — Other Ambulatory Visit: Payer: Self-pay | Admitting: Certified Nurse Midwife

## 2015-11-23 DIAGNOSIS — Z8759 Personal history of other complications of pregnancy, childbirth and the puerperium: Secondary | ICD-10-CM

## 2015-11-23 HISTORY — DX: Personal history of other complications of pregnancy, childbirth and the puerperium: Z87.59

## 2015-12-16 ENCOUNTER — Inpatient Hospital Stay (HOSPITAL_COMMUNITY): Payer: BLUE CROSS/BLUE SHIELD

## 2015-12-16 ENCOUNTER — Encounter (HOSPITAL_COMMUNITY)
Admission: AD | Disposition: A | Discharge status: Home / Self Care | Payer: Self-pay | Source: Ambulatory Visit | Attending: Obstetrics and Gynecology

## 2015-12-16 ENCOUNTER — Encounter (HOSPITAL_COMMUNITY): Payer: Self-pay | Admitting: *Deleted

## 2015-12-16 ENCOUNTER — Inpatient Hospital Stay (HOSPITAL_COMMUNITY): Payer: BLUE CROSS/BLUE SHIELD | Admitting: Anesthesiology

## 2015-12-16 ENCOUNTER — Inpatient Hospital Stay (HOSPITAL_COMMUNITY)
Admission: AD | Admit: 2015-12-16 | Discharge: 2015-12-20 | DRG: 765 | Disposition: A | Discharge status: Home / Self Care | Payer: BLUE CROSS/BLUE SHIELD | Source: Ambulatory Visit | Attending: Obstetrics and Gynecology | Admitting: Obstetrics and Gynecology

## 2015-12-16 DIAGNOSIS — Z3A29 29 weeks gestation of pregnancy: Secondary | ICD-10-CM

## 2015-12-16 DIAGNOSIS — O4593 Premature separation of placenta, unspecified, third trimester: Secondary | ICD-10-CM | POA: Diagnosis present

## 2015-12-16 DIAGNOSIS — O321XX0 Maternal care for breech presentation, not applicable or unspecified: Secondary | ICD-10-CM | POA: Diagnosis present

## 2015-12-16 DIAGNOSIS — Z98891 History of uterine scar from previous surgery: Secondary | ICD-10-CM

## 2015-12-16 DIAGNOSIS — O9081 Anemia of the puerperium: Secondary | ICD-10-CM | POA: Diagnosis not present

## 2015-12-16 DIAGNOSIS — D62 Acute posthemorrhagic anemia: Secondary | ICD-10-CM | POA: Diagnosis not present

## 2015-12-16 DIAGNOSIS — O4693 Antepartum hemorrhage, unspecified, third trimester: Secondary | ICD-10-CM

## 2015-12-16 DIAGNOSIS — O4690 Antepartum hemorrhage, unspecified, unspecified trimester: Secondary | ICD-10-CM

## 2015-12-16 DIAGNOSIS — O368390 Maternal care for abnormalities of the fetal heart rate or rhythm, unspecified trimester, not applicable or unspecified: Secondary | ICD-10-CM

## 2015-12-16 DIAGNOSIS — O4592 Premature separation of placenta, unspecified, second trimester: Secondary | ICD-10-CM | POA: Diagnosis present

## 2015-12-16 LAB — COMPREHENSIVE METABOLIC PANEL
ALT: 17 U/L (ref 14–54)
AST: 21 U/L (ref 15–41)
Albumin: 3.5 g/dL (ref 3.5–5.0)
Alkaline Phosphatase: 69 U/L (ref 38–126)
Anion gap: 7 (ref 5–15)
BUN: 10 mg/dL (ref 6–20)
CHLORIDE: 103 mmol/L (ref 101–111)
CO2: 23 mmol/L (ref 22–32)
CREATININE: 0.65 mg/dL (ref 0.44–1.00)
Calcium: 9.2 mg/dL (ref 8.9–10.3)
GFR calc Af Amer: >60 mL/min (ref >60)
Glucose, Bld: 113 mg/dL — ABNORMAL HIGH (ref 65–99)
Potassium: 4.1 mmol/L (ref 3.5–5.1)
Sodium: 133 mmol/L — ABNORMAL LOW (ref 135–145)
Total Bilirubin: 0.5 mg/dL (ref 0.3–1.2)
Total Protein: 7.4 g/dL (ref 6.5–8.1)

## 2015-12-16 LAB — CBC
HEMATOCRIT: 28.5 % — AB (ref 36.0–46.0)
HEMATOCRIT: 25.7 % — AB (ref 36.0–46.0)
HEMOGLOBIN: 9.6 g/dL — AB (ref 12.0–15.0)
HEMOGLOBIN: 8.5 g/dL — AB (ref 12.0–15.0)
MCH: 23.8 pg — ABNORMAL LOW (ref 26.0–34.0)
MCH: 23.7 pg — ABNORMAL LOW (ref 26.0–34.0)
MCHC: 33.7 g/dL (ref 30.0–36.0)
MCHC: 33.1 g/dL (ref 30.0–36.0)
MCV: 70.7 fL — AB (ref 78.0–100.0)
MCV: 71.8 fL — AB (ref 78.0–100.0)
PLATELETS: 237 10*3/uL (ref 150–400)
PLATELETS: 213 10*3/uL (ref 150–400)
RBC: 4.03 MIL/uL (ref 3.87–5.11)
RBC: 3.58 MIL/uL — AB (ref 3.87–5.11)
RDW: 14.8 % (ref 11.5–15.5)
RDW: 14.8 % (ref 11.5–15.5)
WBC: 17.2 10*3/uL — AB (ref 4.0–10.5)
WBC: 24.7 10*3/uL — AB (ref 4.0–10.5)

## 2015-12-16 LAB — URINE MICROSCOPIC-ADD ON

## 2015-12-16 LAB — URINALYSIS, ROUTINE W REFLEX MICROSCOPIC
BILIRUBIN URINE: NEGATIVE
Glucose, UA: NEGATIVE mg/dL
KETONES UR: NEGATIVE mg/dL
Leukocytes, UA: NEGATIVE
Nitrite: NEGATIVE
PH: 5.5 (ref 5.0–8.0)
Protein, ur: 30 mg/dL — AB
Specific Gravity, Urine: >1.03 — ABNORMAL HIGH (ref 1.005–1.030)

## 2015-12-16 LAB — WET PREP, GENITAL
Sperm: NONE SEEN
Trich, Wet Prep: NONE SEEN
Yeast Wet Prep HPF POC: NONE SEEN

## 2015-12-16 SURGERY — Surgical Case
Anesthesia: General | Site: Abdomen

## 2015-12-16 MED ORDER — FENTANYL CITRATE (PF) 100 MCG/2ML IJ SOLN
50.0000 ug | Freq: Once | INTRAMUSCULAR | Status: AC
  Administered 2015-12-16: 50 ug via INTRAVENOUS
  Filled 2015-12-16: qty 2

## 2015-12-16 MED ORDER — MORPHINE SULFATE (PF) 0.5 MG/ML IJ SOLN
INTRAMUSCULAR | Status: DC | PRN
Start: 2015-12-16 — End: 2015-12-16
  Administered 2015-12-16 (×2): 2 mg via EPIDURAL
  Administered 2015-12-16: 1 mg via EPIDURAL

## 2015-12-16 MED ORDER — ONDANSETRON HCL 4 MG/2ML IJ SOLN
INTRAMUSCULAR | Status: DC | PRN
  Administered 2015-12-16: 4 mg via INTRAVENOUS

## 2015-12-16 MED ORDER — SCOPOLAMINE 1 MG/3DAYS TD PT72
MEDICATED_PATCH | TRANSDERMAL | Status: DC | PRN
  Administered 2015-12-16: 1 via TRANSDERMAL

## 2015-12-16 MED ORDER — MAGNESIUM SULFATE 50 % IJ SOLN
2.0000 g/h | INTRAVENOUS | Status: DC
  Filled 2015-12-16: qty 80

## 2015-12-16 MED ORDER — OXYTOCIN 10 UNIT/ML IJ SOLN
INTRAMUSCULAR | Status: AC
  Filled 2015-12-16: qty 4

## 2015-12-16 MED ORDER — LACTATED RINGERS IV SOLN: INTRAVENOUS | Status: DC

## 2015-12-16 MED ORDER — CEFAZOLIN SODIUM-DEXTROSE 2-4 GM/100ML-% IV SOLN
INTRAVENOUS | Status: AC
  Filled 2015-12-16: qty 100

## 2015-12-16 MED ORDER — FENTANYL CITRATE (PF) 100 MCG/2ML IJ SOLN
INTRAMUSCULAR | Status: AC
  Filled 2015-12-16: qty 2

## 2015-12-16 MED ORDER — MIDAZOLAM HCL 2 MG/2ML IJ SOLN
INTRAMUSCULAR | Status: DC | PRN
  Administered 2015-12-16: 2 mg via INTRAVENOUS

## 2015-12-16 MED ORDER — FAMOTIDINE IN NACL 20-0.9 MG/50ML-% IV SOLN
20.0000 mg | Freq: Once | INTRAVENOUS | Status: DC
Start: 2015-12-16 — End: 2015-12-16
  Filled 2015-12-16: qty 50

## 2015-12-16 MED ORDER — COCONUT OIL OIL: 1.0000 "application " | TOPICAL_OIL | Status: DC | PRN

## 2015-12-16 MED ORDER — PHENYLEPHRINE 8 MG IN D5W 100 ML (0.08MG/ML) PREMIX OPTIME
INJECTION | INTRAVENOUS | Status: AC
  Filled 2015-12-16: qty 100

## 2015-12-16 MED ORDER — MEPERIDINE HCL 25 MG/ML IJ SOLN: 6.2500 mg | INTRAMUSCULAR | Status: DC | PRN

## 2015-12-16 MED ORDER — PRENATAL MULTIVITAMIN CH
1.0000 | ORAL_TABLET | Freq: Every day | ORAL | Status: DC
  Administered 2015-12-16 – 2015-12-19 (×4): 1 via ORAL
  Filled 2015-12-16 (×4): qty 1

## 2015-12-16 MED ORDER — SUCCINYLCHOLINE CHLORIDE 20 MG/ML IJ SOLN
INTRAMUSCULAR | Status: AC
  Filled 2015-12-16: qty 1

## 2015-12-16 MED ORDER — SODIUM CHLORIDE 0.9 % IR SOLN
Status: DC | PRN
  Administered 2015-12-16: 1000 mL

## 2015-12-16 MED ORDER — SUCCINYLCHOLINE 20MG/ML (10ML) SYRINGE FOR MEDFUSION PUMP - OPTIME
INTRAMUSCULAR | Status: DC | PRN
  Administered 2015-12-16: 120 mg via INTRAVENOUS

## 2015-12-16 MED ORDER — METOCLOPRAMIDE HCL 5 MG/ML IJ SOLN: 10.0000 mg | Freq: Once | INTRAMUSCULAR | Status: DC | PRN

## 2015-12-16 MED ORDER — FENTANYL CITRATE (PF) 250 MCG/5ML IJ SOLN
INTRAMUSCULAR | Status: AC
  Filled 2015-12-16: qty 5

## 2015-12-16 MED ORDER — ACETAMINOPHEN 325 MG PO TABS
650.0000 mg | ORAL_TABLET | ORAL | Status: DC | PRN
  Administered 2015-12-16: 650 mg via ORAL

## 2015-12-16 MED ORDER — MEPERIDINE HCL 25 MG/ML IJ SOLN
INTRAMUSCULAR | Status: AC
  Filled 2015-12-16: qty 1

## 2015-12-16 MED ORDER — SIMETHICONE 80 MG PO CHEW
80.0000 mg | CHEWABLE_TABLET | Freq: Three times a day (TID) | ORAL | Status: DC
  Administered 2015-12-16 – 2015-12-20 (×11): 80 mg via ORAL
  Filled 2015-12-16 (×11): qty 1

## 2015-12-16 MED ORDER — MIDAZOLAM HCL 2 MG/2ML IJ SOLN
INTRAMUSCULAR | Status: AC
  Filled 2015-12-16: qty 2

## 2015-12-16 MED ORDER — MEPERIDINE HCL 25 MG/ML IJ SOLN
INTRAMUSCULAR | Status: DC | PRN
  Administered 2015-12-16: 25 mg via INTRAVENOUS

## 2015-12-16 MED ORDER — SOD CITRATE-CITRIC ACID 500-334 MG/5ML PO SOLN
30.0000 mL | Freq: Once | ORAL | Status: AC
  Administered 2015-12-16: 30 mL via ORAL
  Filled 2015-12-16: qty 15

## 2015-12-16 MED ORDER — PROPOFOL 10 MG/ML IV BOLUS
INTRAVENOUS | Status: AC
  Filled 2015-12-16: qty 20

## 2015-12-16 MED ORDER — LACTATED RINGERS IV SOLN
INTRAVENOUS | Status: DC | PRN
  Administered 2015-12-16: 04:00:00 via INTRAVENOUS

## 2015-12-16 MED ORDER — SIMETHICONE 80 MG PO CHEW
80.0000 mg | CHEWABLE_TABLET | ORAL | Status: DC
  Administered 2015-12-16 – 2015-12-19 (×4): 80 mg via ORAL
  Filled 2015-12-16 (×4): qty 1

## 2015-12-16 MED ORDER — LACTATED RINGERS IV SOLN
INTRAVENOUS | Status: DC | PRN
  Administered 2015-12-16 (×4): via INTRAVENOUS

## 2015-12-16 MED ORDER — IBUPROFEN 600 MG PO TABS
600.0000 mg | ORAL_TABLET | Freq: Four times a day (QID) | ORAL | Status: DC
  Administered 2015-12-16 – 2015-12-20 (×15): 600 mg via ORAL
  Filled 2015-12-16 (×16): qty 1

## 2015-12-16 MED ORDER — PROPOFOL 10 MG/ML IV BOLUS
INTRAVENOUS | Status: DC | PRN
  Administered 2015-12-16: 200 mg via INTRAVENOUS

## 2015-12-16 MED ORDER — PHENYLEPHRINE HCL 10 MG/ML IJ SOLN
INTRAMUSCULAR | Status: DC | PRN
  Administered 2015-12-16: 40 ug via INTRAVENOUS

## 2015-12-16 MED ORDER — WITCH HAZEL-GLYCERIN EX PADS: 1.0000 "application " | MEDICATED_PAD | CUTANEOUS | Status: DC | PRN

## 2015-12-16 MED ORDER — SCOPOLAMINE 1 MG/3DAYS TD PT72
MEDICATED_PATCH | TRANSDERMAL | Status: AC
  Filled 2015-12-16: qty 1

## 2015-12-16 MED ORDER — TETANUS-DIPHTH-ACELL PERTUSSIS 5-2.5-18.5 LF-MCG/0.5 IM SUSP
0.5000 mL | Freq: Once | INTRAMUSCULAR | Status: AC
  Administered 2015-12-19: 0.5 mL via INTRAMUSCULAR
  Filled 2015-12-16: qty 0.5

## 2015-12-16 MED ORDER — CEFAZOLIN SODIUM-DEXTROSE 2-3 GM-% IV SOLR
INTRAVENOUS | Status: DC | PRN
  Administered 2015-12-16: 2 g via INTRAVENOUS

## 2015-12-16 MED ORDER — DIPHENHYDRAMINE HCL 25 MG PO CAPS: 25.0000 mg | ORAL_CAPSULE | Freq: Four times a day (QID) | ORAL | Status: DC | PRN

## 2015-12-16 MED ORDER — SIMETHICONE 80 MG PO CHEW: 80.0000 mg | CHEWABLE_TABLET | ORAL | Status: DC | PRN

## 2015-12-16 MED ORDER — FENTANYL CITRATE (PF) 100 MCG/2ML IJ SOLN
25.0000 ug | INTRAMUSCULAR | Status: DC | PRN
  Administered 2015-12-16: 50 ug via INTRAVENOUS

## 2015-12-16 MED ORDER — OXYCODONE-ACETAMINOPHEN 5-325 MG PO TABS
1.0000 | ORAL_TABLET | ORAL | Status: DC | PRN
  Administered 2015-12-16: 2 via ORAL
  Administered 2015-12-16 – 2015-12-17 (×6): 1 via ORAL
  Administered 2015-12-18 – 2015-12-19 (×5): 2 via ORAL
  Administered 2015-12-19: 1 via ORAL
  Administered 2015-12-20: 2 via ORAL
  Filled 2015-12-16 (×2): qty 1
  Filled 2015-12-16: qty 2
  Filled 2015-12-16 (×3): qty 1
  Filled 2015-12-16: qty 2
  Filled 2015-12-16: qty 1
  Filled 2015-12-16 (×2): qty 2
  Filled 2015-12-16 (×3): qty 1
  Filled 2015-12-16: qty 2
  Filled 2015-12-16 (×2): qty 1

## 2015-12-16 MED ORDER — LACTATED RINGERS IV BOLUS (SEPSIS)
1000.0000 mL | Freq: Once | INTRAVENOUS | Status: AC
  Administered 2015-12-16: 1000 mL via INTRAVENOUS

## 2015-12-16 MED ORDER — DEXAMETHASONE SODIUM PHOSPHATE 4 MG/ML IJ SOLN
INTRAMUSCULAR | Status: AC
  Filled 2015-12-16: qty 1

## 2015-12-16 MED ORDER — ZOLPIDEM TARTRATE 5 MG PO TABS: 5.0000 mg | ORAL_TABLET | Freq: Every evening | ORAL | Status: DC | PRN

## 2015-12-16 MED ORDER — MORPHINE SULFATE (PF) 0.5 MG/ML IJ SOLN
INTRAMUSCULAR | Status: AC
  Filled 2015-12-16: qty 10

## 2015-12-16 MED ORDER — FENTANYL CITRATE (PF) 100 MCG/2ML IJ SOLN
INTRAMUSCULAR | Status: AC
  Administered 2015-12-16: 50 ug via INTRAVENOUS
  Filled 2015-12-16: qty 2

## 2015-12-16 MED ORDER — DIBUCAINE 1 % RE OINT: 1.0000 "application " | TOPICAL_OINTMENT | RECTAL | Status: DC | PRN

## 2015-12-16 MED ORDER — SENNOSIDES-DOCUSATE SODIUM 8.6-50 MG PO TABS
2.0000 | ORAL_TABLET | ORAL | Status: DC
  Administered 2015-12-16 – 2015-12-19 (×4): 2 via ORAL
  Filled 2015-12-16 (×4): qty 2

## 2015-12-16 MED ORDER — DEXAMETHASONE SODIUM PHOSPHATE 4 MG/ML IJ SOLN
INTRAMUSCULAR | Status: DC | PRN
  Administered 2015-12-16: 4 mg via INTRAVENOUS

## 2015-12-16 MED ORDER — FENTANYL CITRATE (PF) 100 MCG/2ML IJ SOLN
INTRAMUSCULAR | Status: DC | PRN
  Administered 2015-12-16 (×2): 100 ug via INTRAVENOUS
  Administered 2015-12-16: 250 ug via INTRAVENOUS

## 2015-12-16 MED ORDER — ONDANSETRON HCL 4 MG/2ML IJ SOLN
INTRAMUSCULAR | Status: AC
  Filled 2015-12-16: qty 2

## 2015-12-16 MED ORDER — MAGNESIUM SULFATE BOLUS VIA INFUSION
4.0000 g | Freq: Once | INTRAVENOUS | Status: AC
  Administered 2015-12-16: 4 g via INTRAVENOUS
  Filled 2015-12-16: qty 500

## 2015-12-16 MED ORDER — MENTHOL 3 MG MT LOZG: 1.0000 | LOZENGE | OROMUCOSAL | Status: DC | PRN

## 2015-12-16 MED ORDER — BETAMETHASONE SOD PHOS & ACET 6 (3-3) MG/ML IJ SUSP
12.0000 mg | Freq: Once | INTRAMUSCULAR | Status: AC
  Administered 2015-12-16: 12 mg via INTRAMUSCULAR
  Filled 2015-12-16: qty 2

## 2015-12-16 MED ORDER — OXYTOCIN 40 UNITS IN LACTATED RINGERS INFUSION - SIMPLE MED: 2.5000 [IU]/h | INTRAVENOUS | Status: AC

## 2015-12-16 SURGICAL SUPPLY — 38 items
APL SKNCLS STERI-STRIP NONHPOA (GAUZE/BANDAGES/DRESSINGS) ×1
BARRIER ADHS 3X4 INTERCEED (GAUZE/BANDAGES/DRESSINGS) IMPLANT
BENZOIN TINCTURE PRP APPL 2/3 (GAUZE/BANDAGES/DRESSINGS) ×1 IMPLANT
BRR ADH 4X3 ABS CNTRL BYND (GAUZE/BANDAGES/DRESSINGS)
CLAMP CORD UMBIL (MISCELLANEOUS) IMPLANT
CLOTH BEACON ORANGE TIMEOUT ST (SAFETY) ×2 IMPLANT
CONTAINER PREFILL 10% NBF 15ML (MISCELLANEOUS) IMPLANT
DRSG OPSITE POSTOP 4X10 (GAUZE/BANDAGES/DRESSINGS) ×2 IMPLANT
DURAPREP 26ML APPLICATOR (WOUND CARE) ×2 IMPLANT
ELECT REM PT RETURN 9FT ADLT (ELECTROSURGICAL) ×2
ELECTRODE REM PT RTRN 9FT ADLT (ELECTROSURGICAL) ×1 IMPLANT
EXTRACTOR VACUUM M CUP 4 TUBE (SUCTIONS) IMPLANT
GLOVE BIO SURGEON STRL SZ 6.5 (GLOVE) ×2 IMPLANT
GLOVE BIO SURGEON STRL SZ7 (GLOVE) ×1 IMPLANT
GLOVE BIOGEL PI IND STRL 7.0 (GLOVE) ×1 IMPLANT
GLOVE BIOGEL PI INDICATOR 7.0 (GLOVE) ×3
GOWN STRL REUS W/TWL LRG LVL3 (GOWN DISPOSABLE) ×4 IMPLANT
KIT ABG SYR 3ML LUER SLIP (SYRINGE) ×1 IMPLANT
NDL HYPO 25X5/8 SAFETYGLIDE (NEEDLE) ×1 IMPLANT
NEEDLE HYPO 22GX1.5 SAFETY (NEEDLE) ×1 IMPLANT
NEEDLE HYPO 25X5/8 SAFETYGLIDE (NEEDLE) IMPLANT
NS IRRIG 1000ML POUR BTL (IV SOLUTION) ×2 IMPLANT
PACK C SECTION WH (CUSTOM PROCEDURE TRAY) ×2 IMPLANT
PAD ABD 7.5X8 STRL (GAUZE/BANDAGES/DRESSINGS) ×1 IMPLANT
PAD OB MATERNITY 4.3X12.25 (PERSONAL CARE ITEMS) ×2 IMPLANT
PENCIL SMOKE EVAC W/HOLSTER (ELECTROSURGICAL) ×2 IMPLANT
SPONGE GAUZE 4X4 12PLY STER LF (GAUZE/BANDAGES/DRESSINGS) ×2 IMPLANT
STRIP CLOSURE SKIN 1/2X4 (GAUZE/BANDAGES/DRESSINGS) ×1 IMPLANT
SUT CHROMIC 0 CT 1 (SUTURE) ×1 IMPLANT
SUT CHROMIC 0 CTX 36 (SUTURE) ×4 IMPLANT
SUT PLAIN 0 NONE (SUTURE) IMPLANT
SUT PLAIN 2 0 XLH (SUTURE) IMPLANT
SUT VIC AB 0 CT1 27 (SUTURE) ×14
SUT VIC AB 0 CT1 27XBRD ANBCTR (SUTURE) ×3 IMPLANT
SUT VIC AB 4-0 KS 27 (SUTURE) ×1 IMPLANT
SYR CONTROL 10ML LL (SYRINGE) IMPLANT
TOWEL OR 17X24 6PK STRL BLUE (TOWEL DISPOSABLE) ×3 IMPLANT
TRAY FOLEY CATH SILVER 14FR (SET/KITS/TRAYS/PACK) ×2 IMPLANT

## 2015-12-16 NOTE — Progress Notes (Signed)
J Rasch NP notified of pt's admission and status. Will see pt 

## 2015-12-16 NOTE — Addendum Note (Signed)
Addendum  created 12/16/15 0752 by Elgie CongoNataliya H Alida Greiner, CRNA   Modules edited: Clinical Notes   Clinical Notes:  File: [id number]

## 2015-12-16 NOTE — MAU Provider Note (Signed)
History     CSN: [id number]  Arrival date and time: 12/16/15 [id number]   First Provider Initiated Contact with Patient 12/16/15 0133      Chief Complaint  Patient presents with  . Abdominal Cramping   HPI   [name] is a 30 y.o. female G1P0 at [id number] presenting to MAU with abdominal cramping. The pain started today, this evening. She was seen by her OB today and was without pain or complaints. Movement makes the pain worsen, laying on her side makes the pain ease up. The pain is located in the lower part of stomach on both sides, the pain radiates to her lower back. The pain is constant.   No recent intercourse  + fetal movements Denies vaginal bleeding Denies leaking of fluid    OB History    Gravida Para Term Preterm AB TAB SAB Ectopic Multiple Living   1               Past Medical History  Diagnosis Date  . Low iron   . Macromastia 05/2012  . Irregular menses   . Allergy   . Anemia     Past Surgical History  Procedure Laterality Date  . No past surgeries    . Breast reduction surgery  06/08/2012    Procedure: MAMMARY REDUCTION  (BREAST);  Surgeon: Louisa SecondGerald Truesdale, MD;  Location: [facility];  Service: Plastics;  Laterality: Bilateral;  . Breast surgery      History reviewed. No pertinent family history.  Social History  Substance Use Topics  . Smoking status: Never Smoker   . Smokeless tobacco: Never Used  . Alcohol Use: Yes     Comment: seldom    Allergies:  Allergies  Allergen Reactions  . Soap Itching    Prescriptions prior to admission  Medication Sig Dispense Refill Last Dose  . Prenatal Vit-Fe Fumarate-FA (PRENATAL MULTIVITAMIN) TABS tablet Take 1 tablet by mouth daily at 12 noon.   12/15/2015 at Unknown time  . albuterol (PROVENTIL HFA;VENTOLIN HFA) 108 (90 BASE) MCG/ACT inhaler Inhale 2 puffs into the lungs every 6 (six) hours as needed for wheezing. (Patient not taking: Reported on 01/27/2015) 1 Inhaler 0 Not Taking  .  azelastine (OPTIVAR) 0.05 % ophthalmic solution Place 1 drop into both eyes 2 (two) times daily. (Patient not taking: Reported on 08/04/2015) 6 mL 3 Not Taking  . betamethasone dipropionate (DIPROLENE) 0.05 % cream Apply topically 2 (two) times daily. Reported on 08/04/2015   Not Taking  . ferrous gluconate (FERGON) 325 MG tablet Take 325 mg by mouth 2 (two) times daily. Reported on 08/04/2015   Not Taking  . hydrOXYzine (ATARAX/VISTARIL) 10 MG tablet Take 1 tablet (10 mg total) by mouth 2 (two) times daily as needed for itching. (Patient not taking: Reported on 08/04/2015) 30 tablet 0 Not Taking  . methylPREDNISolone (MEDROL, PAK,) 4 MG tablet follow package directions (Patient not taking: Reported on 01/27/2015) 21 tablet 0 Not Taking  . mometasone (NASONEX) 50 MCG/ACT nasal spray Place 2 sprays into the nose daily. (Patient not taking: Reported on 08/04/2015) 17 g 2 Not Taking   Results for orders placed or performed during the hospital encounter of 12/16/15 (from the past 48 hour(s))  Urinalysis, Routine w reflex microscopic (not at [facility])     Status: Abnormal   Collection Time: 12/16/15  1:15 AM  Result Value Ref Range   Color, Urine YELLOW YELLOW   APPearance CLEAR CLEAR   Specific Gravity, Urine >1.030 (  H) 1.005 - 1.030   pH 5.5 5.0 - 8.0   Glucose, UA NEGATIVE NEGATIVE mg/dL   Hgb urine dipstick SMALL (A) NEGATIVE   Bilirubin Urine NEGATIVE NEGATIVE   Ketones, ur NEGATIVE NEGATIVE mg/dL   Protein, ur 30 (A) NEGATIVE mg/dL   Nitrite NEGATIVE NEGATIVE   Leukocytes, UA NEGATIVE NEGATIVE  Urine microscopic-add on     Status: Abnormal   Collection Time: 12/16/15  1:15 AM  Result Value Ref Range   Squamous Epithelial / LPF 0-5 (A) NONE SEEN   WBC, UA 0-5 0 - 5 WBC/hpf   RBC / HPF 0-5 0 - 5 RBC/hpf   Bacteria, UA FEW (A) NONE SEEN    Review of Systems  Constitutional: Negative for fever.  Gastrointestinal: Positive for nausea. Negative for vomiting, diarrhea and constipation.   Genitourinary: Negative for dysuria.   Physical Exam   Blood pressure 134/63, pulse 88, temperature 98.4 F (36.9 C), resp. rate 18, height 5\' 2"  (1.575 m), weight 208 lb (94.348 kg), last menstrual period 05/21/2015, SpO2 100 %.  Physical Exam  Constitutional: She is oriented to person, place, and time. She appears well-developed and well-nourished. She appears distressed.  GI: Soft. There is generalized tenderness. There is guarding.  Genitourinary:  Fetal fibronectin collected prior to speculum, pink, bloody discharge noted on swab.   Speculum exam: Vagina - Small amount of thick white discharge on vaginal wall, small amount of dark red blood in the vagina  Cervix - No contact bleeding, no active bleeding  Bimanual exam: Cervix closed, external os with effacement  Wet prep done Chaperone present for exam.  Musculoskeletal: Normal range of motion.  Neurological: She is alert and oriented to person, place, and time.  Skin: Skin is warm.  Psychiatric: Her behavior is normal.    Fetal Tracing: Baseline: 120 bpm  Variability: Minimal  Accelerations: none Decelerations: none Toco: UI    MAU Course  Procedures  None  MDM  Wet prep collected Discard fetal fibronectin due to bleeding 0145: Call Dr. Vincente PoliGrewal, discussed fetal tracing, physical exam and labs/test ordered at this time. Dr. Vincente PoliGrewal to come to MAU to evaluate the patient.  IV established Patient requesting pain medication Fentanyl 50 mcg given IV 0225: Dr. Vincente PoliGrewal at bedside and resumes care of the patient.   Duane LopeJennifer I Rasch, NP 12/16/2015 2:36 AM    Assessment and Plan       Duane LopeJennifer I Rasch, NP 12/16/2015 1:57 AM

## 2015-12-16 NOTE — Progress Notes (Signed)
EFM off for bedside u/s. Dr Vincente PoliGrewal at bedside

## 2015-12-16 NOTE — Progress Notes (Signed)
Dr Vincente PoliGrewal in to see pt

## 2015-12-16 NOTE — Anesthesia Postprocedure Evaluation (Signed)
Anesthesia Post Note  Patient: Luz BrazenParis C Brocksmith  Procedure(s) Performed: Procedure(s) (LRB): CESAREAN SECTION (N/A)  Patient location during evaluation: PACU Anesthesia Type: General Level of consciousness: awake and alert Pain management: pain level controlled Vital Signs Assessment: post-procedure vital signs reviewed and stable Respiratory status: spontaneous breathing, nonlabored ventilation, respiratory function stable and patient connected to nasal cannula oxygen Cardiovascular status: blood pressure returned to baseline and stable Postop Assessment: no signs of nausea or vomiting Anesthetic complications: no     Last Vitals:  Filed Vitals:   12/16/15 0428 12/16/15 0430  BP: 139/69 132/71  Pulse: 114 99  Temp: 36.6 C   Resp: 12 18    Last Pain:  Filed Vitals:   12/16/15 0443  PainSc: 6    Pain Goal: Patients Stated Pain Goal: 0 (12/16/15 0215)               Phillips Groutarignan, Shayonna Ocampo

## 2015-12-16 NOTE — Progress Notes (Signed)
Chaplain directed to visit Ms Erin Lester in the NICU to provide spiritual care to her and her husband as they sit with their son. Ms Erin Lester was understandably anxious about her son's condition, but was comforted by the care being provided by the staff. Continued spiritual and emotional support is important.  Benjie Karvonenharles D. Davis Ambrosini, DMin Chaplain

## 2015-12-16 NOTE — Progress Notes (Signed)
occ irregularity in FHR noted with no pattern

## 2015-12-16 NOTE — Anesthesia Postprocedure Evaluation (Signed)
Anesthesia Post Note  Patient: Erin Lester  Procedure(s) Performed: Procedure(s) (LRB): CESAREAN SECTION (N/A)  Patient location during evaluation: [facility] Anesthesia Type: General Level of consciousness: awake and alert and oriented Pain management: pain level controlled Vital Signs Assessment: post-procedure vital signs reviewed and stable Respiratory status: spontaneous breathing and nonlabored ventilation Cardiovascular status: stable Postop Assessment: no signs of nausea or vomiting and adequate PO intake Anesthetic complications: no     Last Vitals:  Filed Vitals:   12/16/15 0626 12/16/15 0725  BP: 112/58 125/57  Pulse: 95 82  Temp: 36.8 C 37.1 C  Resp: 20 20    Last Pain:  Filed Vitals:   12/16/15 0726  PainSc: 0-No pain   Pain Goal: Patients Stated Pain Goal: 0 (12/16/15 0215)               Laban EmperorMalinova,Terel Bann Hristova

## 2015-12-16 NOTE — Transfer of Care (Addendum)
Immediate Anesthesia Transfer of Care Note  Patient: Erin Lester  Procedure(s) Performed: Procedure(s) with comments: CESAREAN SECTION (N/A) - STAT C Section   Patient Location: PACU  Anesthesia Type:General  Level of Consciousness: awake, alert , oriented and patient cooperative  Airway & Oxygen Therapy: Patient Spontanous Breathing and Patient connected to nasal cannula oxygen  Post-op Assessment: Report given to RN and Post -op Vital signs reviewed and stable  Post vital signs: Reviewed and stable  Last Vitals:  TEMP 97.8BP HR 105 RR 18 POX 100  Last Pain:     Pain level- 6-7, medications given, patient states pain is lessening  Complications: No apparent anesthesia complications

## 2015-12-16 NOTE — H&P (Signed)
Erin Lester is a 30 y.o. G 1 P 0 at 6527 w presents with acute onset of severe lower abdominal pain.  Pain started this evening and has progressively worsened.  She also has not noticed a lot of fetal movement today. In triage, FHR is flat  History OB History    Gravida Para Term Preterm AB TAB SAB Ectopic Multiple Living   1              Past Medical History  Diagnosis Date  . Low iron   . Macromastia 05/2012  . Irregular menses   . Allergy   . Anemia    Past Surgical History  Procedure Laterality Date  . No past surgeries    . Breast reduction surgery  06/08/2012    Procedure: MAMMARY REDUCTION  (BREAST);  Surgeon: Louisa SecondGerald Truesdale, MD;  Location: [facility];  Service: Plastics;  Laterality: Bilateral;  . Breast surgery     Family History: family history is not on file. Social History:  reports that she has never smoked. She has never used smokeless tobacco. She reports that she drinks alcohol. She reports that she does not use illicit drugs.   Prenatal Transfer Tool  Maternal Diabetes: No Genetic Screening: Normal Maternal Ultrasounds/Referrals: Normal Fetal Ultrasounds or other Referrals:  None Maternal Substance Abuse:  No Significant Maternal Medications:  None Significant Maternal Lab Results:  None Other Comments:  None  ROS  Dilation: Closed Exam by:: Venia CarbonJennifer Rasch NP Blood pressure 134/63, pulse 88, temperature 98.4 F (36.9 C), resp. rate 18, height 5\' 2"  (1.575 m), weight 94.348 kg (208 lb), last menstrual period 05/21/2015, SpO2 100 %. Exam Physical Exam  Prenatal labs: ABO, Rh:   Antibody:   Rubella:   RPR:    HBsAg:    HIV:    GBS:     Assessment/Plan: PLACENTAL ABRUPTION  C SECTION STAT Johnmark Geiger L 12/16/2015, 3:09 AM

## 2015-12-16 NOTE — Progress Notes (Signed)
Wet prep sent.   

## 2015-12-16 NOTE — MAU Note (Signed)
Dr Vincente PoliGrewal discussing c/s with pt. And family. Dr Willa Fraterarrigan in about 0310 also to talk with pt

## 2015-12-16 NOTE — Brief Op Note (Signed)
12/16/2015  4:10 AM  PATIENT:  Erin Lester  30 y.o. female  PRE-OPERATIVE DIAGNOSIS:   IUP at 27 weeks Placental Abruption  POST-OPERATIVE DIAGNOSIS:  Same  PROCEDURE:  Procedure(s) with comments: CESAREAN SECTION (N/A) - STAT C Section   SURGEON:  Surgeon(s) and Role:    * Marcelle OverlieMichelle Billiejean Schimek, MD - Primary  PHYSICIAN ASSISTANT:   ASSISTANTS: none   ANESTHESIA:   general  EBL:  Total I/O In: 300 [I.V.:300] Out: 500 [Blood:500]  BLOOD ADMINISTERED:none  DRAINS: Urinary Catheter (Foley)   LOCAL MEDICATIONS USED:  NONE  SPECIMEN:  Source of Specimen:  placenta  DISPOSITION OF SPECIMEN:  PATHOLOGY  COUNTS:  YES  TOURNIQUET:  * No tourniquets in log *  DICTATION: .Other Dictation: Dictation Number [id number]  PLAN OF CARE: Admit to inpatient   PATIENT DISPOSITION:  PACU - hemodynamically stable.   Delay start of Pharmacological VTE agent (>24hrs) due to surgical blood loss or risk of bleeding: not applicable

## 2015-12-16 NOTE — Progress Notes (Signed)
To OR via stretcher for code c/s. Dr Vincente PoliGrewal with pt.

## 2015-12-16 NOTE — Progress Notes (Signed)
Patient is doing well. Baby stable in NICU  BP 125/57 mmHg  Pulse 82  Temp(Src) 98.8 F (37.1 C)  Resp 20  Ht 5\' 2"  (1.575 m)  Wt 94.348 kg (208 lb)  BMI 38.03 kg/m2  SpO2 99%  LMP 05/21/2015  Breastfeeding? Unknown Results for orders placed or performed during the hospital encounter of 12/16/15 (from the past 24 hour(s))  Urinalysis, Routine w reflex microscopic (not [facility])     Status: Abnormal   Collection Time: 12/16/15  1:15 AM  Result Value Ref Range   Color, Urine YELLOW YELLOW   APPearance CLEAR CLEAR   Specific Gravity, Urine >1.030 (H) 1.005 - 1.030   pH 5.5 5.0 - 8.0   Glucose, UA NEGATIVE NEGATIVE mg/dL   Hgb urine dipstick SMALL (A) NEGATIVE   Bilirubin Urine NEGATIVE NEGATIVE   Ketones, ur NEGATIVE NEGATIVE mg/dL   Protein, ur 30 (A) NEGATIVE mg/dL   Nitrite NEGATIVE NEGATIVE   Leukocytes, UA NEGATIVE NEGATIVE  Urine microscopic-add on     Status: Abnormal   Collection Time: 12/16/15  1:15 AM  Result Value Ref Range   Squamous Epithelial / LPF 0-5 (A) NONE SEEN   WBC, UA 0-5 0 - 5 WBC/hpf   RBC / HPF 0-5 0 - 5 RBC/hpf   Bacteria, UA FEW (A) NONE SEEN  Wet prep, genital     Status: Abnormal   Collection Time: 12/16/15  1:47 AM  Result Value Ref Range   Yeast Wet Prep HPF POC NONE SEEN NONE SEEN   Trich, Wet Prep NONE SEEN NONE SEEN   Clue Cells Wet Prep HPF POC PRESENT (A) NONE SEEN   WBC, Wet Prep HPF POC MODERATE (A) NONE SEEN   Sperm NONE SEEN   CBC     Status: Abnormal   Collection Time: 12/16/15  2:00 AM  Result Value Ref Range   WBC 17.2 (H) 4.0 - 10.5 K/uL   RBC 4.03 3.87 - 5.11 MIL/uL   Hemoglobin 9.6 (L) 12.0 - 15.0 g/dL   HCT 16.128.5 (L) 09.636.0 - 04.546.0 %   MCV 70.7 (L) 78.0 - 100.0 fL   MCH 23.8 (L) 26.0 - 34.0 pg   MCHC 33.7 30.0 - 36.0 g/dL   RDW 40.914.8 81.111.5 - 91.415.5 %   Platelets 237 150 - 400 K/uL  Comprehensive metabolic panel     Status: Abnormal   Collection Time: 12/16/15  2:00 AM  Result Value Ref Range   Sodium 133 (L) 135 - 145 mmol/L    Potassium 4.1 3.5 - 5.1 mmol/L   Chloride 103 101 - 111 mmol/L   CO2 23 22 - 32 mmol/L   Glucose, Bld 113 (H) 65 - 99 mg/dL   BUN 10 6 - 20 mg/dL   Creatinine, Ser 7.820.65 0.44 - 1.00 mg/dL   Calcium 9.2 8.9 - 95.610.3 mg/dL   Total Protein 7.4 6.5 - 8.1 g/dL   Albumin 3.5 3.5 - 5.0 g/dL   AST 21 15 - 41 U/L   ALT 17 14 - 54 U/L   Alkaline Phosphatase 69 38 - 126 U/L   Total Bilirubin 0.5 0.3 - 1.2 mg/dL   GFR calc non Af Amer >60 >60 mL/min   GFR calc Af Amer >60 >60 mL/min   Anion gap 7 5 - 15  CBC     Status: Abnormal   Collection Time: 12/16/15  5:40 AM  Result Value Ref Range   WBC 24.7 (H) 4.0 - 10.5 K/uL  RBC 3.58 (L) 3.87 - 5.11 MIL/uL   Hemoglobin 8.5 (L) 12.0 - 15.0 g/dL   HCT 16.125.7 (L) 09.636.0 - 04.546.0 %   MCV 71.8 (L) 78.0 - 100.0 fL   MCH 23.7 (L) 26.0 - 34.0 pg   MCHC 33.1 30.0 - 36.0 g/dL   RDW 40.914.8 81.111.5 - 91.415.5 %   Platelets 213 150 - 400 K/uL   Abdomen is soft and non tender Bandage clean and dry IMPRESSION: POD #0 Doing well routine care

## 2015-12-16 NOTE — Progress Notes (Signed)
Dr. Vincente PoliGrewal called regarding pts magnesium drip, okay to discontinue magnesium drip.

## 2015-12-16 NOTE — Addendum Note (Signed)
Addendum  created 12/16/15 [id number] by Collier FlowersElizabeth J Cherisa Brucker, CRNA   Modules edited: Anesthesia Events, Narrator, Narrator Events, Notes Section   Narrator:  Narrator: Event Log Edited   Narrator Events:  Delete Patient re-evaluated event   Notes Section:  File: [id number]

## 2015-12-16 NOTE — Lactation Note (Signed)
This note was copied from a baby's chart. Lactation Consultation Note  Patient Name: Boy Richrd Humblesaris Spieth ZHYQM'VToday's Date: 12/16/2015 Reason for consult: Initial assessment;NICU baby;Breast surgery  NICU baby [id number] GA--18 hours old. Mom delivered by C/S after abruption with 1300 EBL. Mom reports that she had pumped several times, but has not seen colostrum yet. Discussed progression of milk coming to volume. Assisted mom with hand expression with no colostrum present. Mom reports that she had a breast reduction about 4 years ago--2013/2014 she thinks. It appears her nipples were removed and raised. Mom states that her surgeon stated that he thought she would still be able to BF. Discussed with mom the importance of the nerve at the nipple to milk letdown/ejection. Discussed with mom that the only way to see if she will be able to produce and express milk is to pump and try.   Enc mom to pump every 2-3 hours/8 times per 24 hours for 15 minutes followed by hand expression. Mom given supplies for pumping and NICU booklet and LC brochure with review. Mom aware of OP/BFSG and LC phone line assistance after D/C. Enc mom to take EBM to NICU as able. Discussed EBM storage guidelines. Mom aware of pumping rooms in the NICU, and enc mom to call insurance company on Monday to discuss getting a DEBP. Mom aware of [facility] 2-week pump rental as well. Enc mom to call for assistance as needed.   Maternal Data Has patient been taught Hand Expression?: Yes Does the patient have breastfeeding experience prior to this delivery?: No  Feeding    LATCH Score/Interventions                      Lactation Tools Discussed/Used Pump Review: Setup, frequency, and cleaning;Milk Storage Initiated by:: Bedside nurse Date initiated:: 12/16/15   Consult Status Consult Status: Follow-up Date: 12/17/15 Follow-up type: In-patient    Geralynn OchsWILLIARD, Ranika Mcniel 12/16/2015, 10:21 PM

## 2015-12-16 NOTE — Anesthesia Preprocedure Evaluation (Signed)
Anesthesia Evaluation  Patient identified by MRN, date of birth, ID band Patient awake    Reviewed: Allergy & Precautions, NPO status , Patient's Chart, lab work & pertinent test results  Airway Mallampati: II  TM Distance: >3 FB Neck ROM: Full    Dental no notable dental hx.    Pulmonary neg pulmonary ROS,    Pulmonary exam normal breath sounds clear to auscultation       Cardiovascular negative cardio ROS Normal cardiovascular exam Rhythm:Regular Rate:Normal     Neuro/Psych negative neurological ROS  negative psych ROS   GI/Hepatic negative GI ROS, Neg liver ROS,   Endo/Other  negative endocrine ROS  Renal/GU negative Renal ROS  negative genitourinary   Musculoskeletal negative musculoskeletal ROS (+)   Abdominal   Peds negative pediatric ROS (+)  Hematology negative hematology ROS (+)   Anesthesia Other Findings   Reproductive/Obstetrics (+) Pregnancy                             Anesthesia Physical Anesthesia Plan  ASA: II and emergent  Anesthesia Plan: General   Post-op Pain Management:    Induction: Intravenous, Rapid sequence and Cricoid pressure planned  Airway Management Planned: Oral ETT  Additional Equipment:   Intra-op Plan:   Post-operative Plan: Extubation in OR  Informed Consent: I have reviewed the patients History and Physical, chart, labs and discussed the procedure including the risks, benefits and alternatives for the proposed anesthesia with the patient or authorized representative who has indicated his/her understanding and acceptance.   Dental advisory given  Plan Discussed with: CRNA  Anesthesia Plan Comments:         Anesthesia Quick Evaluation

## 2015-12-16 NOTE — Consult Note (Signed)
[facility]  Delivery Note:  C-section       12/16/2015  4:07 AM  I was called to the operating room at the request of the patient's obstetrician (Dr. Grewal) for a stat primary c-section at 27 and 0/[redacted] weeks gestation.    PRENATAL HX:  This is a 29 y/o G1P0 at 27 and 0/[redacted] weeks gestation who presented with abdominal pain.  She was found to have a placental abruption and fetal heart tracing in 130s but had no variability.  Infant delivered by stat c-section.     DELIVERY:  Infant had poor tone and no respiratory effort at delivery with heart rate in the 40s.  PPV administered for 30 seconds with no improvement heart rate.  Copious secretions were suctioned, PIP increased, and PPV given for an additional 30 seconds.  Still no improvement in HR, so intubation attempted.  Intubation difficult as airway was small and 2.5 ETT was difficult to pass.  PPV given between attempts and infant intubated on 3rd attempt at 6 minutes of age.  HR improved to 85 bpm so chest compressions no indicated, but O2 saturations remained low in 40s despite 100% FiO2 and infant was pale and poorly perfused.  Given abruption, an emergent UVC was placed at 9 minutes and emergent release of O- blood was requested.  HR began to rise but remained in low 100s with O2 saturations ~60%.  A 10 ml/kg normal saline bolus was initiated at 11 minutes and completed at 16 minutes of age (blood not yet available).  Surfactant was administered at 13 minutes of age.  During NS bolus and surfactant administration, HR and O2 saturations quickly rose and by 14 minutes of age HR was in 140s and O2 saturations quickly rose to the 90s.  FiO2 weaned and infant transported to NICU on Neopuff settings of 20/5 with 40% FiO2.  APGARs 1, 1 and 2.  Cord gas was 6.89/64 with a base deficit of -22.6.  Infant admitted to NICU for post resuscitation care.   _____________________ Electronically Signed By: Elsy Chiang, MD Neonatologist  

## 2015-12-16 NOTE — Progress Notes (Signed)
Pt oob to wheelchair. NICU requesting mother to brought to the NICU. Pt off floor at this time. Dillion Stowers L Rush Salce,RN

## 2015-12-16 NOTE — Anesthesia Procedure Notes (Signed)
Procedure Name: Intubation Date/Time: 12/16/2015 3:23 AM Performed by: Collier FlowersSPEIGHT, Patric Buckhalter J Pre-anesthesia Checklist: Patient identified, Emergency Drugs available, Suction available, Patient being monitored and Timeout performed Patient Re-evaluated:Patient Re-evaluated prior to inductionOxygen Delivery Method: Circle system utilized Preoxygenation: Pre-oxygenation with 100% oxygen Intubation Type: IV induction, Rapid sequence and Cricoid Pressure applied Laryngoscope Size: Mac and 3 Grade View: Grade I Tube type: Oral Number of attempts: 1 Airway Equipment and Method: Stylet and Video-laryngoscopy Placement Confirmation: ETT inserted through vocal cords under direct vision,  positive ETCO2 and breath sounds checked- equal and bilateral Secured at: 21 (lips) cm Tube secured with: Tape Dental Injury: Teeth and Oropharynx as per pre-operative assessment  Comments: Intubated by Dr Tiffany Kocherarigan

## 2015-12-16 NOTE — Op Note (Signed)
NAMRichrd Humbles:  Nesser, Kris               ACCOUNT NO.:  [id number]  MEDICAL RECORD NO.:  [id number]  LOCATION:  [facility]                          FACILITY:  [facility]  PHYSICIAN:  Hawley Pavia L. Jerzy Crotteau, M.D.DATE OF BIRTH:  [birth year]  DATE OF PROCEDURE:  12/16/2015 DATE OF DISCHARGE:                              OPERATIVE REPORT   PREOPERATIVE DIAGNOSIS:  Intrauterine pregnancy at 27 weeks and placental abruption.  POSTOPERATIVE DIAGNOSIS:  Intrauterine pregnancy at 27 weeks and placental abruption.  PROCEDURE:  Primary low transverse cesarean section stat.  SURGEON:  Raffi Milstein L. Vincente PoliGrewal, M.D.  ANESTHESIA:  General.  ESTIMATED BLOOD LOSS:  500 mL.  COMPLICATIONS:  None.  DRAINS:  Foley.  PATHOLOGY:  Placenta cord, pH 6.89 with deficit of 22.  PROCEDURE:  The patient is a 30 year old, gravida 1, para 0, at 8127 weeks gestation.  She presented to MAU at approximately 1:30 this morning complaining of acute onset of lower abdominal pain.  While in MAU, digital exam revealed a small amount of blood per the nurse- practitioner's exam.  Fetal heart rate was in the normal range, but there was minimal variability.  I was called around 2 a.m. in regard to the fetal heart rate tracing and recommended a stat ultrasound and I arrived around 2:20 to the MAU.  The patient at that time reported her pain was 10 and after 1 dose of fentanyl decreased slightly.  I stayed at the bedside while ultrasound was performed.  Throughout the ultrasound, the heart rate remained in the 120s; however, there was no movement or breathing in minimal time.  I could see a small clot by visualization.  We sent the films stat to Dr. Otho PerlNitsche of MFM who immediately called me and reported there was a large clot and recommended "deliver immediately."  The patient already had an IV in and anesthesia had already talked to her in the interim.  We then emergently took her to the operating room.  I had also discussed the risk of  the surgery with the patient and her family as well as my concern in regard to a placental abruption.  She was then placed on the operating room table.  She was prepped and draped, and thus she was intubated without difficulty by Dr. Acey Lavarignan.  A low transverse incision was made, was carried all the way down to the fascia.  The fascia was then stretched. The midline was identified and the peritoneum was entered bluntly.  This was done very quickly.  The bladder blade was inserted and a low transverse incision was made and the uterus was entered using hemostat. Upon entry, we could see that the fluid was very clear.  The baby was in breech presentation, delivered easily with a female infant.  Cord was clamped and cut.  The baby was handed to the neonatal team and subsequently resuscitation was started.  The cord pH was obtained. There was minimal blood in the cord and the placenta was manually removed.  There was a large bright red clot behind the placenta that probably encompassed at least 50-75% of the placenta.  The placenta was sent to pathology.  The uterus was exteriorized and cleared of all clots  and debris.  The uterine incision was closed in 2 layers using 0 chromic in a running locked stitch.  The uterus was returned to the abdomen. Irrigation was performed.  Hemostasis was very good.  The peritoneum was closed using 0 Vicryl.  The fascia was closed using 0 Vicryl starting at each corner and meeting in the midline.  After irrigation of subcutaneous layer, the skin was closed with a 4-0 Vicryl on a Keith needle.  Steri-Strips were applied.  Pressure dressing was applied.  The patient will be extubated and then go to recovery room and she will then go to the 3rd floor for her postpartum care.  All sponge, lap, and instrument counts were correct x2.     Honor Frison L. Vincente PoliGrewal, M.D.     Florestine AversMLG/MEDQ  D:  12/16/2015  T:  12/16/2015  Job:  [id number]

## 2015-12-17 NOTE — Plan of Care (Signed)
Problem: Pain Management: Goal: General experience of comfort will improve and pain level will decrease Outcome: Completed/Met Date Met:  12/17/15 Good pain control on po Percocet.

## 2015-12-17 NOTE — Lactation Note (Signed)
This note was copied from a baby's chart. Lactation Consultation Note  Mom reports that she has not pumped today. Reminded her of the importance of pumping 8 times in 24 hours using the initiation phase.  Also discussed hand expression with her which she states she understands.  Stressed the impact of even drops of colostrum.  Several visitors were in the room so the visit was minimal. She may need a 2 week rental at discharge. Patient Name: Erin Richrd Humblesaris Hottel [id number]'BToday's Date: 12/17/2015 Reason for consult: Follow-up assessment   Maternal Data    Feeding    LATCH Score/Interventions                      Lactation Tools Discussed/Used     Consult Status Consult Status: Follow-up Date: 12/18/15 Follow-up type: In-patient    Soyla DryerJoseph, Jamey Demchak 12/17/2015, 3:31 PM

## 2015-12-17 NOTE — Progress Notes (Signed)
Chaplain paged to visit Erin Erin Lester after she was told that the prognosis for her child is very dim. She was given a choice concerning her child's continued care. When chaplain arrived he was instructed by Erin Lester's parents not to enter the room until she processed more the bad news she was given. Outside the room Erin Lester's father approached the chaplain to ask for comfort and care. When it was time to speak to Erin Erin Lester, she decided not to speak to the chaplain. She asked that a chaplain come tomorrow, and she may be ready to talk.  Please page the chaplain when Erin Erin Lester is inclined to speak to a chaplain.  Benjie Karvonenharles D. Samaya Boardley, DMin. MDiv Chaplain

## 2015-12-17 NOTE — Progress Notes (Signed)
Patient is doing well. Baby, however, is on multiple pressors and is still critical.  Received chest compressions yesterday and surfactant this am.  BP 135/80 mmHg  Pulse 109  Temp(Src) 98.7 F (37.1 C) (Oral)  Resp 20  Ht 5\' 2"  (1.575 m)  Wt 94.348 kg (208 lb)  BMI 38.03 kg/m2  SpO2 100%  LMP 05/21/2015  Breastfeeding? Unknown  Abdomen is soft and non tender  IMPRESSION: POD #1  Placental abruption  PLAN: Routine care Ambulate, advance diet

## 2015-12-18 NOTE — Lactation Note (Signed)
This note was copied from a baby's chart. Lactation Consultation Note  Mom states she just finished pumping and hand expression.  She is not obtaining colostrum.  Reviewed importance of pumping every 3 hours to establish milk supply.  Discussed importance of any amount of breat milk for her preterm baby.  Mom verbalizes understanding.  Patient Name: Erin Lester BJYNW'GToday's Date: 12/18/2015     Maternal Data    Feeding    LATCH Score/Interventions                      Lactation Tools Discussed/Used     Consult Status      Huston FoleyMOULDEN, Jeannemarie Sawaya S 12/18/2015, 9:33 AM

## 2015-12-18 NOTE — Progress Notes (Signed)
Initial visit with Jonice' family and FOB.  Shequila was sleeping when I entered the room, but FOB was encouraged after a visit with LinzieDanielle Dess CollinSimeon and shared that "he's doing better" today.  They requested prayer specifically for him to start breathing on his own.  I prayed with the family and introduced spiritual care services.    [facility] for follow up tomorrow.  Please page as further needs arise.  Maryanna ShapeAmanda M. Carley Hammedavee Lomax, M.Div. [facility] Chaplain Pager [phone] Office [phone]     12/18/15 1855  Clinical Encounter Type  Visited With Patient and family together  Visit Type Initial;Spiritual support  Referral From Chaplain  Consult/Referral To Chaplain  Spiritual Encounters  Spiritual Needs Prayer;Emotional

## 2015-12-18 NOTE — Clinical Social Work Note (Signed)
CSW met briefly with MOB, FOB and extended family member at the bedside in NICU. CSW introduced self and explained the role of NICU CSW. NICU CSW will attempt to complete NICU assessment later today or in the am.   Loren Racer, LCSW  Coverning NICU for [employer]

## 2015-12-18 NOTE — Progress Notes (Signed)
Subjective: Postpartum Day 2: Cesarean Delivery Patient reports tolerating PO and no problems voiding.   Reported baby improving in NICU Objective: Vital signs in last 24 hours: Temp:  [99.1 F (37.3 C)-99.8 F (37.7 C)] 99.1 F (37.3 C) (06/25 2159) Pulse Rate:  [76-94] 92 (06/25 2159) Resp:  [18-20] 20 (06/25 2159) BP: (115-129)/(66-79) 126/67 mmHg (06/25 2159) SpO2:  [100 %] 100 % (06/25 2159)  Physical Exam:  General: alert and cooperative Lochia: appropriate Uterine Fundus: firm Incision: small old drainage noted on bandage DVT Evaluation: No evidence of DVT seen on physical exam. Negative Homan's sign. No cords or calf tenderness.   Recent Labs  12/16/15 0200 12/16/15 0540  HGB 9.6* 8.5*  HCT 28.5* 25.7*    Assessment/Plan: Status post Cesarean section. Doing well postoperatively.  Continue current care  Cbc in am.  Sanjiv Castorena G 12/18/2015, 8:13 AM

## 2015-12-19 LAB — CBC
HCT: 18.6 % — ABNORMAL LOW (ref 36.0–46.0)
HEMOGLOBIN: 6.1 g/dL — AB (ref 12.0–15.0)
MCH: 24 pg — AB (ref 26.0–34.0)
MCHC: 32.8 g/dL (ref 30.0–36.0)
MCV: 73.2 fL — ABNORMAL LOW (ref 78.0–100.0)
PLATELETS: 192 10*3/uL (ref 150–400)
RBC: 2.54 MIL/uL — ABNORMAL LOW (ref 3.87–5.11)
RDW: 15.2 % (ref 11.5–15.5)
WBC: 10.8 10*3/uL — ABNORMAL HIGH (ref 4.0–10.5)

## 2015-12-19 MED ORDER — FERROUS SULFATE 325 (65 FE) MG PO TABS
325.0000 mg | ORAL_TABLET | Freq: Three times a day (TID) | ORAL | Status: DC
  Administered 2015-12-19 – 2015-12-20 (×3): 325 mg via ORAL
  Filled 2015-12-19 (×3): qty 1

## 2015-12-19 NOTE — Clinical Social Work Maternal (Signed)
CLINICAL SOCIAL WORK MATERNAL/CHILD NOTE  Patient Details  Name: Erin Lester MRN: [id number] Date of Birth: 16-Aug-1985  Date:  12/19/2015  Clinical Social Worker Initiating Note:  Lilly [place], LCSW Date/ Time Initiated:  12/19/15/1407     Child's Name:      Legal Guardian:  Mother   Need for Interpreter:  None   Date of Referral:  12/19/15     Reason for Referral:  Other (Comment) (NICU admission, emotional support)   Referral Source:  NICU   Address:     Phone number:      Household Members:  Spouse   Natural Supports (not living in the home):  Community, Extended Family, Immediate Family, Spouse/significant other   Professional Supports: None   Employment: Part-time   Type of Work:     Education:  Database administrator Resources:  [employer], Multimedia programmer   Other Resources:    LCSW will speak with MOB regarding SSI  Cultural/Religious Considerations Which May Impact Care:  none reported  Strengths:  Ability to meet basic needs , Compliance with medical plan  strong family support     Risk Factors/Current Problems:  Adjustment to Illness    Cognitive State:  Able to Concentrate , Alert , Linear Thinking , Insightful    Mood/Affect:  Bright , Relaxed    CSW Assessment: LCSW aware of new NICU admission and met with MOB at the bedside in the NICU. Chart reviewed along with OB records. Introduced self to [employer], explained reason for consult, and explained services due to baby's admission at 27 weeks.  MOB was very pleasant, receptive, and engaged during assessment.  LCSW found it very easy to engage MOB and she was attentive throughout assessment.   MOB reports overall she is feeling physically better, she is taking her pain medication, and can move around slowly.  MOB reports she has improved in her emotional state and feels "a whole lot better" that a few days ago.  She processes her labor and her prenatal care as she reports she does  not have things lined up for the baby just yet as she had just put a deposit down for her baby shower. MOB and LCSW discuss option of having a "sip and see" with regard to experiencing the baby shower and preparing for the baby. MOB and maternal grandmother have been discussing and planning. MOB expresses excitement and joy about being a parent and watch over her son as he continues to receive care.  LCSW educated MOB about baby's potiental eligibility for Medicaid SSI thought SSA due to babies GA and birth weight.  MOB was very interested and appreciative of information and would like assistance with information and applying.  She is aware that this writer has no control over the process, but will be here for assistance and obtain information needed to apply.   LCSW observed MOB to have positive support from family (MGM with MOB) FOB out in the waiting area with other family members having lunch. LCSW reported ongoing services, support and team to assist her during her stay in the NICU. MOB open for LCSW returning and checking in on her daily or every other day.  MOB voices no questions or concerns at this time.  She is appreciative of time spent and information. LCSW will continue to follow during NICU stay.  MOB aware of how to reach CSW if needed while in hospital.  CSW Plan/Description:  Information/Referral to [employer] , Psychosocial  Support and Ongoing Assessment of Needs    Marshell Garfinkel 12/19/2015, 2:08 PM

## 2015-12-19 NOTE — Progress Notes (Signed)
CRITICAL VALUE ALERT  Critical value received: Hbg 6.1  Date of notification:  12/19/2015  Time of notification: 0600  Critical value read back:Yes.    Nurse who received alert:  Alroy BailiffJ. Terese Heier, RN  MD notified : Dr. Marcelle OverlieHolland  Time of first call:  0616  No new orders obtained

## 2015-12-19 NOTE — Progress Notes (Signed)
Subjective: Postpartum Day 3: Cesarean Delivery Patient reports tolerating PO, + flatus and no problems voiding.   Denies SOB or dizziness Objective: Vital signs in last 24 hours: Temp:  [98.6 F (37 C)-98.9 F (37.2 C)] 98.6 F (37 C) (06/27 0549) Pulse Rate:  [72-120] 72 (06/27 0549) Resp:  [12-20] 18 (06/27 0549) BP: (114-123)/(57-77) 114/72 mmHg (06/27 0549) SpO2:  [99 %-100 %] 100 % (06/27 0549)  Physical Exam:  General: alert and cooperative Lochia: appropriate Uterine Fundus: firm Incision: healing well DVT Evaluation: No evidence of DVT seen on physical exam. Negative Homan's sign. No cords or calf tenderness. Calf/Ankle edema is present.   Recent Labs  12/19/15 0521  HGB 6.1*  HCT 18.6*    Assessment/Plan: Status post Cesarean section. Postoperative course complicated by anemia  feso4.  Aalaysia Liggins G 12/19/2015, 8:50 AM

## 2015-12-19 NOTE — Lactation Note (Signed)
This note was copied from a baby's chart. Lactation Consultation Note  Patient Name: Erin Lester ZOXWR'UToday's Date: 12/19/2015 Reason for consult: Follow-up assessment;NICU baby;Infant < 6lbs   Follow up with mom of 80 hour old infant. Mom reports she is not pumping regularly and is aware she should be. She reports she is starting to get some colostrum and was very happy. Discussed supply and demand with mom and engorgement prevention and treatment. Discussed importance of breast milk for preterm infants. Mom reports her baby is doing better today.   Mom is to call insurance company to see if they offer pumps. She is planning to rent a pump for 2 weeks at d/c. Pump rental paperwork left in room with mom. Mom with no other questions at this time.    Maternal Data Does the patient have breastfeeding experience prior to this delivery?: No  Feeding    LATCH Score/Interventions                      Lactation Tools Discussed/Used WIC Program: No Pump Review: Setup, frequency, and cleaning;Milk Storage Initiated by:: Reviewed   Consult Status Consult Status: Follow-up Date: 12/20/15 Follow-up type: In-patient    Erin Lester 12/19/2015, 12:25 PM

## 2015-12-19 NOTE — Progress Notes (Signed)
I spent time with Erin Lester and her SO who were in good spirits today.  They reported their understanding that baby is doing better today.  They are feeling somewhat relieved, but are still anxious to get him home and know that he is doing well.  We will continue to check in on family throughout their time in the NICU, but please page as needs arise.  Chaplain Dyanne CarrelKaty Kanisha Duba, Bcc Pager, [phone] 3:33 PM    12/19/15 1500  Clinical Encounter Type  Visited With Patient  Visit Type Spiritual support  Referral From Chaplain

## 2015-12-20 MED ORDER — OXYCODONE-ACETAMINOPHEN 5-325 MG PO TABS: 1.0000 | ORAL_TABLET | ORAL | Status: DC | PRN

## 2015-12-20 MED ORDER — IBUPROFEN 600 MG PO TABS: 600.0000 mg | ORAL_TABLET | Freq: Four times a day (QID) | ORAL | Status: DC

## 2015-12-20 MED ORDER — FERROUS SULFATE 325 (65 FE) MG PO TABS: 325.0000 mg | ORAL_TABLET | Freq: Three times a day (TID) | ORAL | Status: DC

## 2015-12-20 NOTE — Progress Notes (Signed)
Subjective: Postpartum Day 4: Cesarean Delivery Patient reports tolerating PO, + flatus and no problems voiding.  No chest pain/SOB.  No dizziness on standing.   Objective: Vital signs in last 24 hours: Temp:  [97.8 F (36.6 C)-99.4 F (37.4 C)] 98.3 F (36.8 C) (06/28 0520) Pulse Rate:  [76-95] 76 (06/28 0520) Resp:  [14-18] 14 (06/28 0520) BP: (114-134)/(58-75) 114/58 mmHg (06/28 0520) SpO2:  [98 %-100 %] 100 % (06/28 0520)  Physical Exam:  General: alert, cooperative and appears stated age Lochia: appropriate Uterine Fundus: firm Incision: healing well, no significant drainage, no dehiscence DVT Evaluation: No evidence of DVT seen on physical exam. Negative Homan's sign. No cords or calf tenderness.   Recent Labs  12/19/15 0521  HGB 6.1*  HCT 18.6*    Assessment/Plan: Status post Cesarean section. Doing well postoperatively.  Discharge home with standard precautions and return to clinic in 4-6 weeks. Acute blood loss on chronic anemia-Continue FeSO4.  Maripat Borba 12/20/2015, 8:48 AM

## 2015-12-20 NOTE — Discharge Summary (Signed)
Obstetric Discharge Summary Reason for Admission: abruptiopn Prenatal Procedures: ultrasound Intrapartum Procedures: cesarean: low cervical, transverse Postpartum Procedures: none Complications-Operative and Postpartum: none HEMOGLOBIN  Date Value Ref Range Status  12/19/2015 6.1* 12.0 - 15.0 g/dL Final    Comment:    REPEATED TO VERIFY CRITICAL RESULT CALLED TO, READ BACK BY AND VERIFIED WITH: J HAMM 12/19/15 AT 0600 BY H SOEWARDIMAN    HCT  Date Value Ref Range Status  12/19/2015 18.6* 36.0 - 46.0 % Final    Physical Exam:  General: alert, cooperative and appears stated age Lochia: appropriate Uterine Fundus: firm Incision: healing well, no significant drainage, no dehiscence DVT Evaluation: No evidence of DVT seen on physical exam. Negative Homan's sign. No cords or calf tenderness.  Discharge Diagnoses: abruption  Discharge Information: Date: 12/20/2015 Activity: pelvic rest Diet: routine Medications: PNV, Ibuprofen, Iron and Percocet Condition: stable Instructions: refer to practice specific booklet Discharge to: home   Newborn Data: Live born female  Birth Weight: 2 lb 0.1 oz (910 g) APGAR: 1, 1  Home with NICU.  Erin Lester 12/20/2015, 8:51 AM

## 2015-12-20 NOTE — Discharge Instructions (Signed)
Call MD for T>100.4, heavy vaginal bleeding, severe abdominal pain, intractable nausea and/or vomiting, or respiratory distress.  Call office to schedule incision and hemoglobin check in 1 week.  No driving while taking narcotics.  Pelvic rest x 6 weeks.

## 2015-12-20 NOTE — Progress Notes (Signed)

## 2015-12-20 NOTE — Lactation Note (Signed)
This note was copied from a baby's chart. Lactation Consultation Note  Patient Name: Erin Lester's Date: 12/20/2015 Reason for consult: Follow-up assessment;NICU baby;Infant < 6lbs   Follow up with mom of 4 do NICU infant. Mom to be d/c home today. Mom reports she is pumping more in the last 24 hours. She reports she is getting up to 5 cc colostrum with pumping. Mom reports she is pleased that she is getting milk. Mom is planning to rent a pump for 2 weeks and to call insurance company to order a pump. Mom with history of breast reduction. Will follow up later today for pump rental, mom has phone # to call me when ready.    Maternal Data Does the patient have breastfeeding experience prior to this delivery?: No  Feeding    LATCH Score/Interventions                      Lactation Tools Discussed/Used WIC Program: No Pump Review: Setup, frequency, and cleaning;Milk Storage   Consult Status Consult Status: Follow-up Date: 12/20/15 Follow-up type: In-patient    Silas FloodSharon S Hice 12/20/2015, 9:06 AM

## 2015-12-20 NOTE — Lactation Note (Signed)
This note was copied from a baby's chart. Lactation Consultation Note  Patient Name: Erin Lester ZOXWR'UToday's Date: 12/20/2015 Reason for consult: Follow-up assessment;Pump rental;NICU baby;Infant < 6lbs   Follow up with mom for pump rental prior to d/c home.   Reviewed importance of pumping every 2-3 hours for 15 minutes with DEBP to initiate supply. Mom with history of breast reduction with complete removal of the nipple. Reviewed with mom that we are unsure if she will be able to produce a full milk supply at this time and reviewed that pumping is extremely important to stimulate her supply. Discussed engorgement prevention/treatment with mom . Discussed with mom that if ducts were severed and did not reattach she may have areas of unresolved engorgement that will involute if unable to express. Mom voiced understanding. Referred mom to Breastfeeding after Breast and Nipple Surgeries-www.bfar.org website.  2 week pump rental completed. Reviewed LC Brochure, mom aware of OP Services, BF Support Groups and LC phone#. Enc mom to call with questions/concerns prn.   Maternal Data Does the patient have breastfeeding experience prior to this delivery?: No  Feeding    LATCH Score/Interventions                      Lactation Tools Discussed/Used WIC Program: No Pump Review: Setup, frequency, and cleaning;Milk Storage   Consult Status Consult Status: PRN Date: 12/20/15 Follow-up type: Call as needed    Ed BlalockSharon S Broderic Bara 12/20/2015, 11:55 AM

## 2018-01-27 ENCOUNTER — Emergency Department (HOSPITAL_COMMUNITY)
Admission: EM | Admit: 2018-01-27 | Discharge: 2018-01-28 | Disposition: A | Discharge status: Home / Self Care | Payer: BLUE CROSS/BLUE SHIELD | Attending: Emergency Medicine | Admitting: Emergency Medicine

## 2018-01-27 ENCOUNTER — Encounter (HOSPITAL_COMMUNITY): Payer: Self-pay

## 2018-01-27 ENCOUNTER — Emergency Department (HOSPITAL_COMMUNITY): Payer: BLUE CROSS/BLUE SHIELD

## 2018-01-27 DIAGNOSIS — R002 Palpitations: Secondary | ICD-10-CM

## 2018-01-27 DIAGNOSIS — R42 Dizziness and giddiness: Secondary | ICD-10-CM | POA: Diagnosis not present

## 2018-01-27 LAB — BASIC METABOLIC PANEL
Anion gap: 11 (ref 5–15)
BUN: 10 mg/dL (ref 6–20)
CHLORIDE: 100 mmol/L (ref 98–111)
CO2: 22 mmol/L (ref 22–32)
CREATININE: 0.86 mg/dL (ref 0.44–1.00)
Calcium: 9.2 mg/dL (ref 8.9–10.3)
Glucose, Bld: 147 mg/dL — ABNORMAL HIGH (ref 70–99)
POTASSIUM: 3.8 mmol/L (ref 3.5–5.1)
SODIUM: 133 mmol/L — AB (ref 135–145)

## 2018-01-27 LAB — CBC WITH DIFFERENTIAL/PLATELET
ABS IMMATURE GRANULOCYTES: 0 10*3/uL (ref 0.0–0.1)
BASOS ABS: 0 10*3/uL (ref 0.0–0.1)
Basophils Relative: 0 %
Eosinophils Absolute: 0.1 10*3/uL (ref 0.0–0.7)
Eosinophils Relative: 1 %
HCT: 35.5 % — ABNORMAL LOW (ref 36.0–46.0)
HEMOGLOBIN: 10.8 g/dL — AB (ref 12.0–15.0)
IMMATURE GRANULOCYTES: 0 %
LYMPHS PCT: 42 %
Lymphs Abs: 3.6 10*3/uL (ref 0.7–4.0)
MCH: 22.7 pg — ABNORMAL LOW (ref 26.0–34.0)
MCHC: 30.4 g/dL (ref 30.0–36.0)
MCV: 74.7 fL — ABNORMAL LOW (ref 78.0–100.0)
Monocytes Absolute: 0.5 10*3/uL (ref 0.1–1.0)
Monocytes Relative: 6 %
NEUTROS ABS: 4.5 10*3/uL (ref 1.7–7.7)
NEUTROS PCT: 51 %
PLATELETS: 329 10*3/uL (ref 150–400)
RBC: 4.75 MIL/uL (ref 3.87–5.11)
RDW: 15 % (ref 11.5–15.5)
WBC: 8.7 10*3/uL (ref 4.0–10.5)

## 2018-01-27 LAB — I-STAT TROPONIN, ED: TROPONIN I, POC: 0 ng/mL (ref 0.00–0.08)

## 2018-01-27 NOTE — ED Triage Notes (Addendum)
Pt presents with 3 episodes of palpitations, R sided chest pain with each episode lasting a few minutes.  Pt reports she was seated at the time; reports similar episode in the past when she was hungry. Pt also reports at times when she turns her head she sees "stars".

## 2018-01-27 NOTE — ED Provider Notes (Signed)
[facility]   CSN: [id number] Arrival date & time: 01/27/18  1931     History   Chief Complaint Chief Complaint  Patient presents with  . Palpitations    HPI Erin Lester is a 32 y.o. female.   The history is provided by the patient and medical records.   Palpitations    Associated symptoms include chest pain.    32 y.o. F with hx of seasonal allergies, Fe+ deficiency anemia, irregular menses, macromastia, presenting to the ED for palpitations.  Patient reports 3 episodes of palpitations and right sided chest pain today.  States when this initially started she was sitting on the couch and was extremely hungry as she had not eaten several hours.  Reports she got very flushed, somewhat sweaty and turn her head abruptly and noticed some spots in her vision.  States she sat down and rested for a few minutes and this resolved.  Reports this happened a few more times but improved after she ate.  She reports history of similar episodes when very hungry in the past.  She has no known cardiac history.  No history of DVT or PE.  No known family cardiac history.  She is not a smoker.  No known thyroid issues.  States last routine physical several years ago.  Past Medical History:  Diagnosis Date  . Allergy   . Anemia   . Irregular menses   . Low iron   . Macromastia 05/2012    Patient Active Problem List   Diagnosis Date Noted  . S/P cesarean section 12/16/2015    Past Surgical History:  Procedure Laterality Date  . BREAST REDUCTION SURGERY  06/08/2012   Procedure: MAMMARY REDUCTION  (BREAST);  Surgeon: Louisa SecondGerald Truesdale, MD;  Location:  SURGERY CENTER;  Service: Plastics;  Laterality: Bilateral;  . BREAST SURGERY    . CESAREAN SECTION N/A 12/16/2015   Procedure: CESAREAN SECTION;  Surgeon: Marcelle OverlieMichelle Grewal, MD;  [facility];  Service: Obstetrics;  Laterality: N/A;  STAT C Section   . NO PAST SURGERIES        OB History    Gravida  1   Para  1   Term      Preterm  1   AB      Living  1     SAB      TAB      Ectopic      Multiple  0   Live Births  1            Home Medications    Prior to Admission medications   Medication Sig Start Date End Date Taking? Authorizing Provider  ferrous sulfate 325 (65 FE) MG tablet Take 325 mg by mouth daily with breakfast.   Yes [provider]  folic acid (FOLVITE) 1 MG tablet Take 2 mg by mouth 2 (two) times daily.  01/13/18  Yes [provider]  pyridOXINE (VITAMIN B-6) 100 MG tablet Take 100 mg by mouth daily.   Yes [provider]    Family History History reviewed. No pertinent family history.  Social History Social History   Tobacco Use  . Smoking status: Never Smoker  . Smokeless tobacco: Never Used  Substance Use Topics  . Alcohol use: Yes    Comment: seldom  . Drug use: No     Allergies   Soap   Review of Systems Review of Systems  Cardiovascular: Positive for  chest pain and palpitations.  All other systems reviewed and are negative.    Physical Exam Updated Vital Signs BP 137/89 (BP Location: Left Arm)   Pulse 98   Temp 99.1 F (37.3 C) (Oral)   Resp 18   Ht 5\' 2"  (1.575 m)   Wt 91.6 kg (202 lb)   LMP 01/04/2018 (Exact Date)   SpO2 100%   BMI 36.95 kg/m    Physical Exam  Constitutional: She is oriented to person, place, and time. She appears well-developed and well-nourished.  HENT:  Head: Normocephalic and atraumatic.  Mouth/Throat: Oropharynx is clear and moist.  Eyes: Pupils are equal, round, and reactive to light. Conjunctivae and EOM are normal.  Neck: Normal range of motion.  Cardiovascular: Normal rate, regular rhythm and normal heart sounds.  Pulmonary/Chest: Effort normal and breath sounds normal. No stridor. No respiratory distress.  Abdominal: Soft. Bowel sounds are normal. There is no tenderness. There is no rebound.  Musculoskeletal: Normal  range of motion.  Neurological: She is alert and oriented to person, place, and time.  Skin: Skin is warm and dry.  Psychiatric: She has a normal mood and affect.  Nursing note and vitals reviewed.    ED Treatments / Results  Labs (all labs ordered are listed, but only abnormal results are displayed) Labs Reviewed  CBC WITH DIFFERENTIAL/PLATELET - Abnormal; Notable for the following components:      Result Value   Hemoglobin 10.8 (*)    HCT 35.5 (*)    MCV 74.7 (*)    MCH 22.7 (*)    All other components within normal limits  BASIC METABOLIC PANEL - Abnormal; Notable for the following components:   Sodium 133 (*)    Glucose, Bld 147 (*)    All other components within normal limits  I-STAT TROPONIN, ED    EKG None  Radiology Dg Chest 2 View  Result Date: 01/28/2018 CLINICAL DATA:  Acute onset of palpitations and right-sided chest pain. EXAM: CHEST - 2 VIEW COMPARISON:  None. FINDINGS: The lungs are well-aerated and clear. There is no evidence of focal opacification, pleural effusion or pneumothorax. The heart is normal in size; the mediastinal contour is within normal limits. No acute osseous abnormalities are seen. IMPRESSION: No acute cardiopulmonary process seen. Electronically Signed   By: Roanna Raider M.D.   On: 01/28/2018 00:08    Procedures Procedures (including critical care time)  Medications Ordered in ED Medications - No data to display   Initial Impression / Assessment and Plan / ED Course  I have reviewed the triage vital signs and the nursing notes.  Pertinent labs & imaging results that were available during my care of the patient were reviewed by me and considered in my medical decision making (see chart for details).  32 y.o. F here with palpitations.  After talking with her, she had whole prodrome of other symptoms-feeling lightheaded, flushed, sweaty on the back of her neck, and palpitations.  Reports this occurred when she was extremely hungry after  not eating for several hours.  Reports hx of same.  Questionably low blood sugar today.  She is not diabetic.  EKG here is nonischemic.  Labs are reassuring.  Has known iron deficiency anemia, currently taking supplements.  Hemoglobin today is improved from priors.  Denies any abnormal bleeding.  Chest x-ray was added which is negative.  She is not tachycardic or hypoxic, lower suspicion for PE.  Patient does admit to some issues with  fatigue lately, thyroid  studies were sent that PCP can follow-up on.  Low suspicion for ACS, PE, dissection, acute cardiac event at this time.  She is stable for discharge.  We will have her follow-up closely with her primary care doctor.  Final Clinical Impressions(s) / ED Diagnoses   Final diagnoses:  Palpitations  Lightheadedness    ED Discharge Orders    None      Garlon Hatchet, PA-C 01/28/18 1610  Shon Baton, MD 01/28/18 [phone]

## 2018-01-27 NOTE — ED Notes (Signed)
Patient transported to X-ray 

## 2018-01-28 LAB — TSH: TSH: 3.15 u[IU]/mL (ref 0.350–4.500)

## 2018-01-28 NOTE — Discharge Instructions (Signed)
Work-up today looked great.  Thyroid studies have been sent, I would have your primary care doctor follow-up on these. Recommend follow-up with primary care doctor for full physical since it has been a few years. Return here for any new/acute changes.

## 2018-01-29 LAB — T4: T4 TOTAL: 7.6 ug/dL (ref 4.5–12.0)

## 2018-05-06 ENCOUNTER — Encounter: Payer: Self-pay | Admitting: Neurology

## 2018-05-06 ENCOUNTER — Ambulatory Visit (INDEPENDENT_AMBULATORY_CARE_PROVIDER_SITE_OTHER): Payer: BLUE CROSS/BLUE SHIELD | Admitting: Neurology

## 2018-05-06 VITALS — BP 136/84 | HR 76 | Ht 62.5 in | Wt 209.0 lb

## 2018-05-06 DIAGNOSIS — G4719 Other hypersomnia: Secondary | ICD-10-CM | POA: Diagnosis not present

## 2018-05-06 DIAGNOSIS — R079 Chest pain, unspecified: Secondary | ICD-10-CM

## 2018-05-06 DIAGNOSIS — R002 Palpitations: Secondary | ICD-10-CM | POA: Diagnosis not present

## 2018-05-06 DIAGNOSIS — E669 Obesity, unspecified: Secondary | ICD-10-CM | POA: Diagnosis not present

## 2018-05-06 DIAGNOSIS — R0683 Snoring: Secondary | ICD-10-CM

## 2018-05-06 NOTE — Progress Notes (Signed)
Subjective:    Patient ID: Erin Lester is a 32 y.o. female.  HPI     Erin FoleySaima Chakita Mcgraw, MD, PhD [facility] [address], [address] [address] [place], [place] [id number]  Dear Dr. Rosemary HolmsPatwardhan,   I saw your patient, Erin Lester, upon your kind request in my sleep clinic today for initial consultation of her sleep disorder, in particular, concern for underlying obstructive sleep apnea. The patient is unaccompanied today. As you know, Erin Lester is a 32 year old right-handed woman with an underlying medical history of anemia, allergies, palpitations and obesity, who reports snoring and excessive daytime somnolence. I reviewed your office note from 03/09/2018, which you kindly included. Her Epworth sleepiness score is 9 out of 24 today, fatigue score is 48 of 63. She is married and lives with her husband and her son, age 2. She currently does not work. 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 32 She is a nonsmoker and does not utilize alcohol on a regular basis, does not drink caffeine daily. Her bedtime is typically between 9 and 10 but she has some difficulty falling asleep. She has had palpitations and chest discomfort, some lightheadedness reported. She is scheduled for an echocardiogram next month as I understand. She does not have night to night nocturia or morning headaches. She has no known family history of sleep apnea. Her husband has reported witnessed apneas to her when she asked him recently. Her weight has increased.  Her Past Medical History Is Significant For: Past Medical History:  Diagnosis Date  . Allergy   . Anemia   . Irregular menses   . Low iron   . Macromastia 05/2012    Her Past Surgical History Is Significant For: Past Surgical History:  Procedure Laterality Date  . BREAST REDUCTION SURGERY  06/08/2012   Procedure: MAMMARY REDUCTION  (BREAST);  Surgeon: Louisa SecondGerald Truesdale, MD;  Location:  SURGERY CENTER;  Service: Plastics;  Laterality:  Bilateral;  . BREAST SURGERY    . CESAREAN SECTION N/A 12/16/2015   Procedure: CESAREAN SECTION;  Surgeon: Marcelle OverlieMichelle Grewal, MD;  [facility];  Service: Obstetrics;  Laterality: N/A;  STAT C Section   . NO PAST SURGERIES      Her Family History Is Significant For: No family history on file.  Her Social History Is Significant For: Social History   Socioeconomic History  . Marital status: Married    Spouse name: Not on file  . Number of children: Not on file  . Years of education: Not on file  . Highest education level: Not on file  Occupational History  . Not on file  Social Needs  . Financial resource strain: Not on file  . Food insecurity:    Worry: Not on file    Inability: Not on file  . Transportation needs:    Medical: Not on file    Non-medical: Not on file  Tobacco Use  . Smoking status: Never Smoker  . Smokeless tobacco: Never Used  Substance and Sexual Activity  . Alcohol use: Yes    Comment: seldom  . Drug use: No  . Sexual activity: Yes    Birth control/protection: Condom, None  Lifestyle  . Physical activity:    Days per week: Not on file    Minutes per session: Not on file  . Stress: Not on file  Relationships  . Social connections:    Talks on phone: Not on file    Gets together: Not on file  Attends religious service: Not on file    Active member of club or organization: Not on file    Attends meetings of clubs or organizations: Not on file    Relationship status: Not on file  Other Topics Concern  . Not on file  Social History Narrative  . Not on file    Her Allergies Are:  Allergies  Allergen Reactions  . Soap Itching    Floral/Fruity Soap  :   Her Current Medications Are: 32  Outpatient Encounter Medications as of 05/06/2018  Medication Sig  . ferrous sulfate 325 (65 FE) MG tablet Take 325 mg by mouth daily with breakfast.  . folic acid (FOLVITE) 1 MG tablet Take 2 mg by mouth 2 (two) times daily.   Marland Kitchen pyridOXINE  (VITAMIN B-6) 100 MG tablet Take 100 mg by mouth daily.   No facility-administered encounter medications on file as of 05/06/2018.   :  Review of Systems:  Out of a complete 14 point review of systems, all are reviewed and negative with the exception of these symptoms as listed below: Review of Systems  Neurological:       Pt presents today to discuss her sleep. Pt has never had a sleep study but does endorse snoring.  Epworth Sleepiness Scale 0= would never doze 1= slight chance of dozing 2= moderate chance of dozing 3= high chance of dozing  Sitting and reading: 2 Watching TV: 2 Sitting inactive in a public place (ex. Theater or meeting): 0 As a passenger in a car for an hour without a break: 1 Lying down to rest in the afternoon: 3 Sitting and talking to someone: 0 Sitting quietly after lunch (no alcohol): 1 In a car, while stopped in traffic: 0 Total: 9     Objective:  Neurological Exam  Physical Exam Physical Examination:   Vitals:   05/06/18 1603  BP: 136/84  Pulse: 76   General Examination: The patient is a very pleasant 32 y.o. female in no acute distress. She appears well-developed and well-nourished and well groomed.   HEENT: Normocephalic, atraumatic, pupils are equal, round and reactive to light and accommodation. Extraocular tracking is good without limitation to gaze excursion or nystagmus noted. Normal smooth pursuit is noted. Hearing is grossly intact. Face is symmetric with normal facial animation and normal facial sensation. Speech is clear with no dysarthria noted. There is no hypophonia. There is no lip, neck/head, jaw or voice tremor. Neck is supple with full range of passive and active motion. There are no carotid bruits on auscultation. Oropharynx exam reveals: mild mouth dryness, good dental hygiene and moderate airway crowding, due to larger uvula, Tonsils of 3+ on the right and 2+ on the L. Mallampati is class II. Tongue protrudes centrally and  palate elevates symmetrically. Neck size is 14.75 inches. She has a Mild overbite. Nasal inspection reveals mild erythema.   Chest: Clear to auscultation without wheezing, rhonchi or crackles noted.  Heart: S1+S2+0, regular and normal without murmurs, rubs or gallops noted.   Abdomen: Soft, non-tender and non-distended with normal bowel sounds appreciated on auscultation.  Extremities: There is no pitting edema in the distal lower extremities bilaterally. Pedal pulses are intact.  Skin: Warm and dry without trophic changes noted.  Musculoskeletal: exam reveals no obvious joint deformities, tenderness or joint swelling or erythema.   Neurologically:  Mental status: The patient is awake, alert and oriented in all 4 spheres. Her immediate and remote memory, attention, language skills and fund of knowledge are  appropriate. There is no evidence of aphasia, agnosia, apraxia or anomia. Speech is clear with normal prosody and enunciation. Thought process is linear. Mood is normal and affect is normal.  Cranial nerves II - XII are as described above under HEENT exam. In addition: shoulder shrug is normal with equal shoulder height noted. Motor exam: Normal bulk, strength and tone is noted. There is no drift, tremor or rebound. Romberg is negative. Reflexes: . Fine motor skills and coordination: intact with normal finger taps, normal hand movements, normal rapid alternating patting, normal foot taps and normal foot agility.  Cerebellar testing: No dysmetria or intention tremor, no gait ataxia.  Sensory exam: intact to light touch in the UEs and LEs.  Gait, station and balance: She stands easily. No veering to one side is noted. No leaning to one side is noted. Posture is age-appropriate and stance is narrow based. Gait shows normal stride length and normal pace. No problems turning are noted. Tandem walk is unremarkable.   Assessment and Plan:  In summary, BLU MCGLAUN is a very pleasant 32 y.o.-year  old female with an underlying medical history of anemia, allergies, palpitations and obesity, whose history and physical exam are concerning for obstructive sleep apnea (OSA). I had a long chat with the patient about my findings and the diagnosis of OSA, its prognosis and treatment options. We talked about medical treatments, surgical interventions and non-pharmacological approaches. I explained in particular the risks and ramifications of untreated moderate to severe OSA, especially with respect to developing cardiovascular disease down the Road, including congestive heart failure, difficult to treat hypertension, cardiac arrhythmias, or stroke. Even type 2 diabetes has, in part, been linked to untreated OSA. Symptoms of untreated OSA include daytime sleepiness, memory problems, mood irritability and mood disorder such as depression and anxiety, lack of energy, as well as recurrent headaches, especially morning headaches. We talked about trying to maintain a healthy lifestyle in general, as well as the importance of weight control. I encouraged the patient to eat healthy, exercise daily and keep well hydrated, to keep a scheduled bedtime and wake time routine, to not skip any meals and eat healthy snacks in between meals. I advised the patient not to drive when feeling sleepy. I recommended the following at this time: sleep study with potential positive airway pressure titration. (We will score hypopneas at 3%).   I explained the sleep test procedure to the patient and also outlined possible surgical and non-surgical treatment options of OSA, including the use of a custom-made dental device (which would require a referral to a specialist dentist or oral surgeon), upper airway surgical options, such as pillar implants, radiofrequency surgery, tongue base surgery, and UPPP (which would involve a referral to an ENT surgeon). Rarely, jaw surgery such as mandibular advancement may be considered.  I also explained  the CPAP treatment option to the patient, who indicated that she would be willing to try CPAP if the need arises. I explained the importance of being compliant with PAP treatment, not only for insurance purposes but primarily to improve Her symptoms, and for the patient's long term health benefit, including to reduce Her cardiovascular risks. I answered all her questions today and the patient was in agreement. I plan to see her back after the sleep study is completed and encouraged her to call with any interim questions, concerns, problems or updates.   Thank you very much for allowing me to participate in the care of this nice patient. If I can  be of any further assistance to you please do not hesitate to call me at [phone].  Sincerely,   Star Age, MD, PhD

## 2018-05-06 NOTE — Patient Instructions (Addendum)

## 2018-05-31 ENCOUNTER — Ambulatory Visit (INDEPENDENT_AMBULATORY_CARE_PROVIDER_SITE_OTHER): Payer: BLUE CROSS/BLUE SHIELD | Admitting: Neurology

## 2018-05-31 DIAGNOSIS — G4710 Hypersomnia, unspecified: Secondary | ICD-10-CM

## 2018-05-31 DIAGNOSIS — G4719 Other hypersomnia: Secondary | ICD-10-CM

## 2018-05-31 DIAGNOSIS — R002 Palpitations: Secondary | ICD-10-CM

## 2018-05-31 DIAGNOSIS — R0683 Snoring: Secondary | ICD-10-CM

## 2018-05-31 DIAGNOSIS — E669 Obesity, unspecified: Secondary | ICD-10-CM

## 2018-05-31 DIAGNOSIS — R079 Chest pain, unspecified: Secondary | ICD-10-CM

## 2018-06-04 ENCOUNTER — Telehealth: Payer: Self-pay

## 2018-06-04 NOTE — Telephone Encounter (Signed)
I called pt and advised her of her sleep study results. Pt verbalized understanding of results. Pt had no questions at this time but was encouraged to call back if questions arise.

## 2018-06-04 NOTE — Procedures (Signed)
PATIENT'S NAME:  Erin Lester, [name] DOB:      1985-08-04      MR#:    [id number]     DATE OF RECORDING: 05/31/2018 REFERRING M.D.:  Truett MainlandManish Patwardhan, MD Study Performed:   Baseline Polysomnogram HISTORY: 32 year old woman with a history of anemia, allergies, palpitations and obesity, who reports snoring and excessive daytime somnolence. Her Epworth sleepiness score is 9 out of 24. The patient's weight 209 pounds with a height of 62 (inches), resulting in a BMI of 37.6 kg/m2. The patient's neck circumference measured 14.8 inches.  CURRENT MEDICATIONS: Ferrous sulfate, Folvite, Vitamin B6   PROCEDURE:  This is a multichannel digital polysomnogram utilizing the Somnostar 11.2 system.  Electrodes and sensors were applied and monitored per AASM Specifications.   EEG, EOG, Chin and Limb EMG, were sampled at 200 Hz.  ECG, Snore and Nasal Pressure, Thermal Airflow, Respiratory Effort, CPAP Flow and Pressure, Oximetry was sampled at 50 Hz. Digital video and audio were recorded.      BASELINE STUDY  Lights Out was at 22:09 and Lights On at 04:59.  Total recording time (TRT) was 410.5 minutes, with a total sleep time (TST) of 308.5 minutes.   The patient's sleep latency was 67.5 minutes, which is delayed. REM latency was 126.5 minutes, which is mildly delayed. The sleep efficiency was 75.2 %.     SLEEP ARCHITECTURE: WASO (Wake after sleep onset) was 41 minutes with one longer period of wakefulness and otherwise no significant sleep fragmentation noted.  There were 8.5 minutes in Stage N1, 168 minutes Stage N2, 60.5 minutes Stage N3 and 71.5 minutes in Stage REM.  The percentage of Stage N1 was 2.8%, Stage N2 was 54.5%, Stage N3 was 19.6% and Stage R (REM sleep) was 23.2%. The arousals were noted as: 3 were spontaneous, 0 were associated with PLMs, 3 were associated with respiratory events.  RESPIRATORY ANALYSIS:  There were a total of 9 respiratory events:  0 obstructive apneas, 0 central apneas and 0 mixed  apneas with a total of 0 apneas and an apnea index (AI) of 0 /hour. There were 9 hypopneas with a hypopnea index of 1.8 /hour. The patient also had 0 respiratory event related arousals (RERAs).      The total APNEA/HYPOPNEA INDEX (AHI) was 1.8 /hour and the total RESPIRATORY DISTURBANCE INDEX was 0. 1.8 /hour.  3 events occurred in REM sleep and 12 events in NREM. The REM AHI was 0. 2.5 /hour, versus a non-REM AHI of 1.5. The patient spent 40.5 minutes of total sleep time in the supine position and 268 minutes in non-supine.. The supine AHI was 8.9 versus a non-supine AHI of 0.7.  OXYGEN SATURATION & C02:  The Wake baseline 02 saturation was 97%, with the lowest being 92%. Time spent below 89% saturation equaled 0 minutes.  PERIODIC LIMB MOVEMENTS: The patient had a total of 0 Periodic Limb Movements.  The Periodic Limb Movement (PLM) index was 0 and the PLM Arousal index was 0/hour.  Audio and video analysis did not show any abnormal or unusual movements, behaviors, phonations or vocalizations. The patient took no bathroom breaks. Mild intermittent snoring was noted. The EKG was in keeping with normal sinus rhythm (NSR).  Post-study, the patient indicated that sleep was worse than usual.   IMPRESSION:  1. Primary Snoring  RECOMMENDATIONS:  1. This study does not demonstrate any significant obstructive or central sleep disordered breathing. Only mild snoring was noted. This study does not support an intrinsic sleep  disorder as a cause of the patient's symptoms. Other causes, including circadian rhythm disturbances, an underlying mood disorder, medication effect and/or an underlying medical problem cannot be ruled out. 2. The patient should be cautioned not to drive, work at heights, or operate dangerous or heavy equipment when tired or sleepy. Review and reiteration of good sleep hygiene measures should be pursued with any patient. 3. The patient will be advised to follow up with the referring  provider, who will be notified of the test results.  I certify that I have reviewed the entire raw data recording prior to the issuance of this report in accordance with the Standards of Accreditation of the American Academy of Sleep Medicine (AASM)     Huston Foley, MD, PhD Diplomat, American Board of Neurology and Sleep Medicine (Neurology and Sleep Medicine)

## 2018-06-04 NOTE — Progress Notes (Signed)
Patient referred by cardilogist, seen by me on 05/06/18, diagnostic PSG on 05/31/18.   Please call and notify the patient that the recent sleep study did not show any significant obstructive sleep apnea and only mild snoring. Achieved all stages of sleep, O2 sats at or above 92%. Can FU with PCP and cardiology as scheduled.   Thanks,  Huston FoleySaima Kreig Parson, MD, PhD [facility]

## 2018-06-04 NOTE — Telephone Encounter (Signed)
-----   Message from Huston FoleySaima Athar, MD sent at 06/04/2018  7:47 AM EST ----- Patient referred by cardilogist, seen by me on 05/06/18, diagnostic PSG on 05/31/18.   Please call and notify the patient that the recent sleep study did not show any significant obstructive sleep apnea and only mild snoring. Achieved all stages of sleep, O2 sats at or above 92%. Can FU with PCP and cardiology as scheduled.   Thanks,  Huston FoleySaima Athar, MD, PhD [facility])

## 2018-10-01 ENCOUNTER — Telehealth: Payer: BLUE CROSS/BLUE SHIELD | Admitting: Physician Assistant

## 2018-10-01 DIAGNOSIS — J069 Acute upper respiratory infection, unspecified: Secondary | ICD-10-CM

## 2018-10-01 NOTE — Progress Notes (Signed)
Based on what you shared with me, I feel your condition warrants further evaluation and I recommend that you be seen for a face to face office visit.     NOTE: If you entered your credit card information for this eVisit, you will not be charged. You may see a "hold" on your card for the $35 but that hold will drop off and you will not have a charge processed.  If you are having a true medical emergency please call 911.  If you need an urgent face to face visit, [place] has four urgent care centers for your convenience.    PLEASE NOTE: THE [facility] LOCATIONS AND URGENT CARE CLINICS DO NOT HAVE THE TESTING FOR CORONAVIRUS COVID19 AVAILABLE.  IF YOU FEEL YOU NEED THIS TEST YOU MUST GO TO A TRIAGE LOCATION AT ONE OF THE HOSPITAL EMERGENCY DEPARTMENTS   [email or web address] to reserve your spot online an avoid wait times  [facility] [address], [address] [place], [place] [address] Modified hours of operation: Monday-Friday, 10 AM to 6 PM  Saturday & Sunday 10 AM to 4 PM *Across the street from [employer]  [facility] (New Address!) [address], [address] [place], [place] [address] *Just off [address], across the road from [employer]* Modified hours of operation: Monday-Friday, 10 AM to 5 PM  Closed Saturday & Sunday   The following sites will take your insurance:  . [facility]  [phone] Get Driving Directions Find a Provider at this Location  [address] [place], [place] [address] . 10 am to 8 pm Monday-Friday . 12 pm to 8 pm Saturday-Sunday   . [facility]  [phone] Get Driving Directions Find a Provider at this Location  [address], [address] [place], [place] [address] . 8 am to 8 pm Monday-Friday . 9 am to 6 pm Saturday . 11 am to 6 pm Sunday   . [facility] [place]  [phone] Get Driving Directions  [address] [place], [place] [address] . 8 am to 8 pm Monday-Friday . 8 am to 4 pm Saturday-Sunday   Your e-visit answers were reviewed by a board certified advanced clinical practitioner to complete your personal care plan.  Thank you for using e-Visits. 

## 2018-10-08 ENCOUNTER — Encounter (HOSPITAL_COMMUNITY): Payer: Self-pay

## 2018-10-08 ENCOUNTER — Emergency Department (HOSPITAL_COMMUNITY)
Admission: EM | Admit: 2018-10-08 | Discharge: 2018-10-08 | Disposition: A | Discharge status: Home / Self Care | Payer: 59 | Attending: Emergency Medicine | Admitting: Emergency Medicine

## 2018-10-08 ENCOUNTER — Other Ambulatory Visit: Payer: Self-pay

## 2018-10-08 DIAGNOSIS — R630 Anorexia: Secondary | ICD-10-CM | POA: Diagnosis not present

## 2018-10-08 DIAGNOSIS — R197 Diarrhea, unspecified: Secondary | ICD-10-CM | POA: Diagnosis not present

## 2018-10-08 DIAGNOSIS — Z79899 Other long term (current) drug therapy: Secondary | ICD-10-CM | POA: Insufficient documentation

## 2018-10-08 DIAGNOSIS — M791 Myalgia, unspecified site: Secondary | ICD-10-CM | POA: Diagnosis not present

## 2018-10-08 DIAGNOSIS — R5383 Other fatigue: Secondary | ICD-10-CM | POA: Insufficient documentation

## 2018-10-08 DIAGNOSIS — R509 Fever, unspecified: Secondary | ICD-10-CM | POA: Diagnosis not present

## 2018-10-08 LAB — BASIC METABOLIC PANEL
Anion gap: 10 (ref 5–15)
BUN: 6 mg/dL (ref 6–20)
CO2: 22 mmol/L (ref 22–32)
Calcium: 9.5 mg/dL (ref 8.9–10.3)
Chloride: 105 mmol/L (ref 98–111)
Creatinine, Ser: 0.9 mg/dL (ref 0.44–1.00)
GFR calc Af Amer: >60 mL/min (ref >60)
GFR calc non Af Amer: >60 mL/min (ref >60)
Glucose, Bld: 101 mg/dL — ABNORMAL HIGH (ref 70–99)
Potassium: 3.9 mmol/L (ref 3.5–5.1)
Sodium: 137 mmol/L (ref 135–145)

## 2018-10-08 LAB — CBC
HCT: 38.2 % (ref 36.0–46.0)
Hemoglobin: 11.6 g/dL — ABNORMAL LOW (ref 12.0–15.0)
MCH: 22.8 pg — ABNORMAL LOW (ref 26.0–34.0)
MCHC: 30.4 g/dL (ref 30.0–36.0)
MCV: 75.2 fL — ABNORMAL LOW (ref 80.0–100.0)
Platelets: 361 10*3/uL (ref 150–400)
RBC: 5.08 MIL/uL (ref 3.87–5.11)
RDW: 15.9 % — ABNORMAL HIGH (ref 11.5–15.5)
WBC: 11.5 10*3/uL — ABNORMAL HIGH (ref 4.0–10.5)
nRBC: 0 % (ref 0.0–0.2)

## 2018-10-08 MED ORDER — SODIUM CHLORIDE 0.9 % IV BOLUS
1000.0000 mL | Freq: Once | INTRAVENOUS | Status: AC
  Administered 2018-10-08: 1000 mL via INTRAVENOUS

## 2018-10-08 NOTE — ED Triage Notes (Signed)
Pt is alert and oriented x 4 and states that she has body aches, and low grade temp, and fatigue x 3 weeks. Pt denies n=being exposed to any persons with + covid.

## 2018-10-08 NOTE — ED Provider Notes (Signed)
[facility]-EMERGENCY DEPT Provider Note   CSN: [id number] Arrival date & time: 10/08/18  1828    History   Chief Complaint Chief Complaint  Patient presents with  . Generalized Body Aches    HPI Erin Lester is a 33 y.o. female.     Erin Lester is a 33 y.o. female with a history of seasonal allergies and anemia, who presents to the emergency department for evaluation of persistent body aches.  She reports approximately 3 weeks ago she developed fevers, fatigue, body aches, cough, shortness of breath, nausea vomiting and diarrhea.  She had a telemedicine visit with her primary care doctor who told her she was presumed to have COVID-19 infection versus other viral infection, he recommended symptomatic treatment with Tylenol and over-the-counter medications rest and self quarantine at home.  She reports that her respiratory symptoms have completely resolved she still has an occasional low-grade fever and a few episodes of diarrhea, no additional vomiting or abdominal pain but her myalgias have persisted.  She reports that her arms and legs and back constantly feel achy as if "she is done a strenuous workout".  She denies any urinary symptoms.  She is continue to try and drink plenty and has had some Gatorade intermittently to keep her electrolytes.  No new sick contacts.  She reports that her cough and shortness of breath have completely resolved.     Past Medical History:  Diagnosis Date  . Allergy   . Anemia   . Irregular menses   . Low iron   . Macromastia 05/2012    Patient Active Problem List   Diagnosis Date Noted  . S/P cesarean section 12/16/2015    Past Surgical History:  Procedure Laterality Date  . BREAST REDUCTION SURGERY  06/08/2012   Procedure: MAMMARY REDUCTION  (BREAST);  Surgeon: Louisa Second, MD;  Location: [facility];  Service: Plastics;  Laterality: Bilateral;  . BREAST SURGERY    . CESAREAN SECTION N/A  12/16/2015   Procedure: CESAREAN SECTION;  Surgeon: Marcelle Overlie, MD;  Location: [facility] BIRTHING SUITES;  Service: Obstetrics;  Laterality: N/A;  STAT C Section   . NO PAST SURGERIES       OB History    Gravida  1   Para  1   Term      Preterm  1   AB      Living  1     SAB      TAB      Ectopic      Multiple  0   Live Births  1            Home Medications    Prior to Admission medications   Medication Sig Start Date End Date Taking? Authorizing Provider  ferrous sulfate 325 (65 FE) MG tablet Take 325 mg by mouth daily with breakfast.    [provider]  folic acid (FOLVITE) 1 MG tablet Take 2 mg by mouth 2 (two) times daily.  01/13/18   [provider]  pyridOXINE (VITAMIN B-6) 100 MG tablet Take 100 mg by mouth daily.    [provider]    Family History History reviewed. No pertinent family history.  Social History Social History   Tobacco Use  . Smoking status: Never Smoker  . Smokeless tobacco: Never Used  Substance Use Topics  . Alcohol use: Yes    Comment: seldom  . Drug use: No     Allergies  Soap   Review of Systems Review of Systems  Constitutional: Positive for chills and fatigue. Negative for fever.  HENT: Negative for congestion, rhinorrhea and sore throat.   Respiratory: Negative for cough and shortness of breath.   Cardiovascular: Negative for chest pain.  Gastrointestinal: Positive for diarrhea. Negative for abdominal pain, nausea and vomiting.  Genitourinary: Negative for dysuria and frequency.  Musculoskeletal: Positive for myalgias. Negative for arthralgias.  Skin: Negative for color change and rash.  Neurological: Negative for dizziness, weakness, light-headedness, numbness and headaches.     Physical Exam Updated Vital Signs BP 136/88 (BP Location: Right Arm)   Pulse 81   Temp 99.5 F (37.5 C) (Oral)   Resp 20   Ht  (1.575 m)   Wt 90.7 kg   SpO2 99%   BMI 36.58 kg/m   Physical  Exam Vitals signs and nursing note reviewed.  Constitutional:      General: She is not in acute distress.    Appearance: Normal appearance. She is well-developed and normal weight. She is not ill-appearing or diaphoretic.  HENT:     Head: Normocephalic and atraumatic.     Right Ear: Tympanic membrane and ear canal normal.     Left Ear: Tympanic membrane and ear canal normal.     Nose: Nose normal.     Mouth/Throat:     Mouth: Mucous membranes are moist.     Pharynx: Oropharynx is clear. No oropharyngeal exudate or posterior oropharyngeal erythema.     Comments: Mucous membranes moist Eyes:     General:        Right eye: No discharge.        Left eye: No discharge.     Pupils: Pupils are equal, round, and reactive to light.  Neck:     Musculoskeletal: Neck supple.  Cardiovascular:     Rate and Rhythm: Normal rate and regular rhythm.     Heart sounds: Normal heart sounds. No murmur. No friction rub. No gallop.   Pulmonary:     Effort: Pulmonary effort is normal. No respiratory distress.     Breath sounds: Normal breath sounds. No wheezing or rales.     Comments: Respirations equal and unlabored, patient able to speak in full sentences, lungs clear to auscultation bilaterally Abdominal:     General: Bowel sounds are normal. There is no distension.     Palpations: Abdomen is soft. There is no mass.     Tenderness: There is no abdominal tenderness. There is no guarding.     Comments: Abdomen soft, nondistended, nontender to palpation in all quadrants without guarding or peritoneal signs  Musculoskeletal:        General: No deformity.  Skin:    General: Skin is warm and dry.     Capillary Refill: Capillary refill takes less than 2 seconds.  Neurological:     Mental Status: She is alert and oriented to person, place, and time.     Coordination: Coordination normal.     Comments: Speech is clear, able to follow commands CN III-XII intact Normal strength in upper and lower  extremities bilaterally including dorsiflexion and plantar flexion, strong and equal grip strength Sensation normal to light and sharp touch Moves extremities without ataxia, coordination intact  Psychiatric:        Mood and Affect: Mood normal.        Behavior: Behavior normal.      ED Treatments / Results  Labs (all labs ordered are listed, but  only abnormal results are displayed) Labs Reviewed  BASIC METABOLIC PANEL - Abnormal; Notable for the following components:      Result Value   Glucose, Bld 101 (*)    All other components within normal limits  CBC - Abnormal; Notable for the following components:   WBC 11.5 (*)    Hemoglobin 11.6 (*)    MCV 75.2 (*)    MCH 22.8 (*)    RDW 15.9 (*)    All other components within normal limits    EKG None  Radiology No results found.  Procedures Procedures (including critical care time)  Medications Ordered in ED Medications  sodium chloride 0.9 % bolus 1,000 mL (0 mLs Intravenous Stopped 10/08/18 2035)     Initial Impression / Assessment and Plan / ED Course  I have reviewed the triage vital signs and the nursing notes.  Pertinent labs & imaging results that were available during my care of the patient were reviewed by me and considered in my medical decision making (see chart for details).  Patient presents with persistent myalgias.  3 weeks ago she developed fevers, cough, shortness of breath, and myalgias.  Myalgias have persisted even though the rest of the symptoms have improved.  She initially consulted with her PCP via telemedicine who told her she likely had COVID-19 infection, she did not have testing to confirm.  She continues to have occasional low-grade fevers and reports that her myalgias have persisted she continues to have a few episodes of diarrhea and some decreased p.o. intake.  On arrival she has low-grade temp of 99.5 but all other vitals are normal and she is well-appearing.  Her lungs are clear and abdomen  is benign.  She has a normal neurologic exam.  Myalgias may be related to prolonged course of virus but given she is also having diarrhea and decreased p.o. intake at 1 to make sure patient does not have electrolyte derangements worsening the symptoms.  Will give 1 L fluid bolus and check basic labs.  Labs show mild leukocytosis of 11.5, hemoglobin of 11.6, labs show no electrolyte derangements and normal renal function.  I discussed these reassuring results with the patient.  I suspect this may be sequelae of COVID-19 viral infection, causing prolonged symptoms.  Will have patient follow closely with her primary care doctor, return precautions discussed.  She expresses understanding and agreement with plan.  Discharged home in good condition.  Final Clinical Impressions(s) / ED Diagnoses   Final diagnoses:  Myalgia    ED Discharge Orders    None       Legrand RamsFord, Trenda Corliss N, PA-C 10/10/18 1131    Mancel BaleWentz, Elliott, MD 10/11/18 1025

## 2018-10-08 NOTE — Discharge Instructions (Signed)
Lab work is reassuring, myalgias could be continued from your recent viral illness, and can be worsened by dehydration.  Please make sure you are drinking plenty of water.  Make sure you are getting plenty of rest.  Monitor symptoms for any worsening.  Follow-up with your primary care doctor if symptoms are not improving in the next week.

## 2018-10-17 ENCOUNTER — Emergency Department (HOSPITAL_COMMUNITY): Payer: 59

## 2018-10-17 ENCOUNTER — Emergency Department (HOSPITAL_COMMUNITY)
Admission: EM | Admit: 2018-10-17 | Discharge: 2018-10-17 | Disposition: A | Discharge status: Home / Self Care | Payer: 59 | Attending: Emergency Medicine | Admitting: Emergency Medicine

## 2018-10-17 ENCOUNTER — Other Ambulatory Visit: Payer: Self-pay

## 2018-10-17 DIAGNOSIS — B9789 Other viral agents as the cause of diseases classified elsewhere: Secondary | ICD-10-CM

## 2018-10-17 DIAGNOSIS — Z79899 Other long term (current) drug therapy: Secondary | ICD-10-CM | POA: Insufficient documentation

## 2018-10-17 DIAGNOSIS — J069 Acute upper respiratory infection, unspecified: Secondary | ICD-10-CM | POA: Insufficient documentation

## 2018-10-17 DIAGNOSIS — R05 Cough: Secondary | ICD-10-CM | POA: Diagnosis present

## 2018-10-17 LAB — CBC
HCT: 34.8 % — ABNORMAL LOW (ref 36.0–46.0)
Hemoglobin: 10.6 g/dL — ABNORMAL LOW (ref 12.0–15.0)
MCH: 22.8 pg — ABNORMAL LOW (ref 26.0–34.0)
MCHC: 30.5 g/dL (ref 30.0–36.0)
MCV: 74.8 fL — ABNORMAL LOW (ref 80.0–100.0)
Platelets: 345 10*3/uL (ref 150–400)
RBC: 4.65 MIL/uL (ref 3.87–5.11)
RDW: 15.5 % (ref 11.5–15.5)
WBC: 5.9 10*3/uL (ref 4.0–10.5)
nRBC: 0 % (ref 0.0–0.2)

## 2018-10-17 LAB — BASIC METABOLIC PANEL
Anion gap: 8 (ref 5–15)
BUN: 5 mg/dL — ABNORMAL LOW (ref 6–20)
CO2: 22 mmol/L (ref 22–32)
Calcium: 9.1 mg/dL (ref 8.9–10.3)
Chloride: 107 mmol/L (ref 98–111)
Creatinine, Ser: 0.75 mg/dL (ref 0.44–1.00)
GFR calc Af Amer: >60 mL/min (ref >60)
GFR calc non Af Amer: >60 mL/min (ref >60)
Glucose, Bld: 97 mg/dL (ref 70–99)
Potassium: 3.9 mmol/L (ref 3.5–5.1)
Sodium: 137 mmol/L (ref 135–145)

## 2018-10-17 LAB — I-STAT BETA HCG BLOOD, ED (MC, WL, AP ONLY): I-stat hCG, quantitative: <5 m[IU]/mL (ref <5)

## 2018-10-17 NOTE — ED Provider Notes (Signed)
[facility]-EMERGENCY DEPT Provider Note   CSN: [id number] Arrival date & time: 10/17/18  0907    History   Chief Complaint Chief Complaint  Patient presents with  . Cough    HPI Erin Lester is a 33 y.o. female.     HPI Patient is a 33 year old female presents the emergency department ongoing intermittent cough since March 24.  She occasionally feels short of breath with exertion.  She reports diarrhea without nausea vomiting.  She reports chills and subjective low-grade fevers at home.  She did check her temperature yesterday and found to be 99.5.  No known contacts with COVID-19 positive patients or patients under investigation for COVID-19.  Denies chest pain.  Denies abdominal pain.   Past Medical History:  Diagnosis Date  . Allergy   . Anemia   . Irregular menses   . Low iron   . Macromastia 05/2012    Patient Active Problem List   Diagnosis Date Noted  . S/P cesarean section 12/16/2015    Past Surgical History:  Procedure Laterality Date  . BREAST REDUCTION SURGERY  06/08/2012   Procedure: MAMMARY REDUCTION  (BREAST);  Surgeon: Louisa Second, MD;  Location: [facility];  Service: Plastics;  Laterality: Bilateral;  . BREAST SURGERY    . CESAREAN SECTION N/A 12/16/2015   Procedure: CESAREAN SECTION;  Surgeon: Marcelle Overlie, MD;  Location: [facility] BIRTHING SUITES;  Service: Obstetrics;  Laterality: N/A;  STAT C Section   . NO PAST SURGERIES       OB History    Gravida  1   Para  1   Term      Preterm  1   AB      Living  1     SAB      TAB      Ectopic      Multiple  0   Live Births  1            Home Medications    Prior to Admission medications   Medication Sig Start Date End Date Taking? Authorizing Provider  acetaminophen (TYLENOL) 500 MG tablet Take 1,000 mg by mouth every 6 (six) hours as needed for mild pain or fever.   Yes [provider]  albuterol (VENTOLIN HFA) 108 (90 Base)  MCG/ACT inhaler Inhale 1-2 puffs into the lungs every 6 (six) hours as needed for wheezing or shortness of breath.  09/29/18  Yes [provider]  folic acid (FOLVITE) 1 MG tablet Take 2 mg by mouth 2 (two) times daily.  01/13/18  Yes [provider]  Multiple Vitamins-Minerals (ALIVE WOMENS GUMMY) CHEW Chew 1 tablet by mouth daily.   Yes [provider]  pyridOXINE (VITAMIN B-6) 100 MG tablet Take 100 mg by mouth daily.   Yes [provider]    Family History No family history on file.  Social History Social History   Tobacco Use  . Smoking status: Never Smoker  . Smokeless tobacco: Never Used  Substance Use Topics  . Alcohol use: Yes    Comment: seldom  . Drug use: No     Allergies   Soap   Review of Systems Review of Systems  All other systems reviewed and are negative.    Physical Exam Updated Vital Signs BP 123/79   Pulse 68   Temp 98.3 F (36.8 C) (Oral)   Resp 18   LMP 10/10/2018   SpO2 100%   Physical Exam Vitals signs  and nursing note reviewed.  Constitutional:      General: She is not in acute distress.    Appearance: She is well-developed.  HENT:     Head: Normocephalic and atraumatic.  Neck:     Musculoskeletal: Normal range of motion.  Cardiovascular:     Rate and Rhythm: Normal rate and regular rhythm.     Heart sounds: Normal heart sounds.  Pulmonary:     Effort: Pulmonary effort is normal.     Breath sounds: Normal breath sounds.  Abdominal:     General: There is no distension.     Palpations: Abdomen is soft.     Tenderness: There is no abdominal tenderness.  Musculoskeletal: Normal range of motion.  Skin:    General: Skin is warm and dry.  Neurological:     Mental Status: She is alert and oriented to person, place, and time.  Psychiatric:        Judgment: Judgment normal.      ED Treatments / Results  Labs (all labs ordered are listed, but only abnormal results are displayed) Labs Reviewed   CBC - Abnormal; Notable for the following components:      Result Value   Hemoglobin 10.6 (*)    HCT 34.8 (*)    MCV 74.8 (*)    MCH 22.8 (*)    All other components within normal limits  BASIC METABOLIC PANEL - Abnormal; Notable for the following components:   BUN 5 (*)    All other components within normal limits  I-STAT BETA HCG BLOOD, ED ([facility], [facility], [facility] ONLY)    EKG None  Radiology Dg Chest Portable 1 View  Result Date: 10/17/2018 CLINICAL DATA:  33 year old female with history of productive cough since September 15, 2018. Shortness of breath with exertion. Fatigue, diarrhea and body aches. EXAM: PORTABLE CHEST 1 VIEW COMPARISON:  Chest x-ray 01/27/2018. FINDINGS: Lung volumes are normal. No consolidative airspace disease. No pleural effusions. No pneumothorax. No pulmonary nodule or mass noted. Pulmonary vasculature and the cardiomediastinal silhouette are within normal limits. IMPRESSION: No radiographic evidence of acute cardiopulmonary disease. Electronically Signed   By: Trudie Reedaniel  Entrikin M.D.   On: 10/17/2018 11:23    Procedures Procedures (including critical care time)  Medications Ordered in ED Medications - No data to display   Initial Impression / Assessment and Plan / ED Course  I have reviewed the triage vital signs and the nursing notes.  Pertinent labs & imaging results that were available during my care of the patient were reviewed by me and considered in my medical decision making (see chart for details).        Overall well-appearing.  Work-up in the emergency department without significant abnormality.  Doubt PE.  Doubt dissection.  Discharged home in good condition  Final Clinical Impressions(s) / ED Diagnoses   Final diagnoses:  Viral URI with cough    ED Discharge Orders    None       Azalia Bilisampos, Aneliz Carbary, MD 10/17/18 1149

## 2018-10-17 NOTE — ED Triage Notes (Signed)
Per Pt: Pt reports she has had a productive cough since march 24th. Pt reports she feels SOB with exertion.  Pt reports diarrhea but denies N/V Pt reports fatigue and a 99.5 temperature at home.

## 2018-11-20 ENCOUNTER — Other Ambulatory Visit: Payer: Self-pay | Admitting: Gastroenterology

## 2018-11-20 DIAGNOSIS — R1310 Dysphagia, unspecified: Secondary | ICD-10-CM

## 2018-11-27 ENCOUNTER — Ambulatory Visit
Admission: RE | Admit: 2018-11-27 | Discharge: 2018-11-27 | Disposition: A | Discharge status: Home / Self Care | Payer: 59 | Source: Ambulatory Visit | Attending: Gastroenterology | Admitting: Gastroenterology

## 2018-11-27 DIAGNOSIS — R1310 Dysphagia, unspecified: Secondary | ICD-10-CM

## 2018-12-09 ENCOUNTER — Telehealth: Payer: Self-pay | Admitting: Neurology

## 2018-12-09 NOTE — Telephone Encounter (Signed)
Pt gave consent for VV on the phone/ Pt understands that although there may be some limitations with this type of visit, we will take all precautions to reduce any security or privacy concerns.  Pt understands that this will be treated like an in office visit and we will file with pt's insurance, and there may be a patient responsible charge related to this service. °

## 2018-12-10 ENCOUNTER — Encounter: Payer: Self-pay | Admitting: Allergy

## 2018-12-10 ENCOUNTER — Ambulatory Visit (INDEPENDENT_AMBULATORY_CARE_PROVIDER_SITE_OTHER): Payer: 59 | Admitting: Allergy

## 2018-12-10 ENCOUNTER — Other Ambulatory Visit: Payer: Self-pay

## 2018-12-10 VITALS — BP 102/68 | HR 74 | Temp 97.7°F | Resp 14 | Ht 62.0 in | Wt 193.8 lb

## 2018-12-10 DIAGNOSIS — T781XXD Other adverse food reactions, not elsewhere classified, subsequent encounter: Secondary | ICD-10-CM

## 2018-12-10 DIAGNOSIS — J4520 Mild intermittent asthma, uncomplicated: Secondary | ICD-10-CM

## 2018-12-10 DIAGNOSIS — J3089 Other allergic rhinitis: Secondary | ICD-10-CM

## 2018-12-10 MED ORDER — LEVOCETIRIZINE DIHYDROCHLORIDE 5 MG PO TABS: 5.0000 mg | ORAL_TABLET | Freq: Every evening | ORAL | 5 refills | Status: DC

## 2018-12-10 MED ORDER — EPINEPHRINE 0.3 MG/0.3ML IJ SOAJ: 0.3000 mg | Freq: Once | INTRAMUSCULAR | 2 refills | Status: AC

## 2018-12-10 MED ORDER — AZELASTINE HCL 0.1 % NA SOLN: 1.0000 | Freq: Two times a day (BID) | NASAL | 5 refills | Status: DC

## 2018-12-10 NOTE — Telephone Encounter (Signed)
I called pt to update her chart prior to her virtual visit. Pt asked to call me back.

## 2018-12-10 NOTE — Progress Notes (Signed)
New Patient Note  RE: Erin Lester MRN: [id number] DOB: 1986-01-09 Date of Office Visit: 12/10/2018  Referring provider: Christa See, FNP Primary care provider: Christa See, FNP  Chief Complaint: allergies and asthma  History of present illness: Erin Lester is a 33 y.o. female presenting today for consultation for allergies and asthma.    She reports post-nasal drainage with throat clearing primarily and also reports occasional sneezing and ear fullness.  Symptoms are worse in the spring.  She has tried Human resources officer, zyrtec, claritin which have not helped much.  She reports flonase may work for a short while and then will make the drainage worse.    She reports being allergic to fruits including cherries, bananas, oranges, watermelon make her "mouth throb".  This has been known for years now. She reports having "weird symptoms" including intermittent chills and body aches without any fever since March 24th. She reports she has cut out dairy and sugary foods to see if this would help with these nonspecific symptoms but this has not helped.   She states she has had numerous testing from her PCP including inflammatory markers, CBC, vitamin B12 and folate levels which has been normal per the patient.   She reports when her allergy symptoms are bad she can have wheezing and coughing bouts.   She has had an albuterol inhaler but reports haven't needed to use in 6-7 years.  She does notice these symptoms also with activity. No nighttime awakenings.  Denies any need for hospitalization or systemic steroid use.  She does report having eczema primarily on hands.  Uses aveeno lotion.  Uses dove unscented soap.    Review of systems: Review of Systems  Constitutional: Positive for chills. Negative for fever and malaise/fatigue.  HENT: Positive for congestion and sore throat. Negative for ear discharge, nosebleeds and sinus pain.   Eyes: Negative for pain, discharge and redness.  Respiratory:  Negative for cough, shortness of breath and wheezing.   Cardiovascular: Negative for chest pain.  Gastrointestinal: Negative for abdominal pain, constipation, diarrhea, heartburn, nausea and vomiting.  Musculoskeletal: Positive for myalgias.  Skin: Positive for itching and rash.  Neurological: Negative for headaches.    All other systems negative unless noted above in HPI  Past medical history: Past Medical History:  Diagnosis Date  . Allergy   . Anemia   . Eczema   . Irregular menses   . Low iron   . Macromastia 05/2012    Past surgical history: Past Surgical History:  Procedure Laterality Date  . BREAST REDUCTION SURGERY  06/08/2012   Procedure: MAMMARY REDUCTION  (BREAST);  Surgeon: Cristine Polio, MD;  Location: [facility];  Service: Plastics;  Laterality: Bilateral;  . BREAST SURGERY    . CESAREAN SECTION N/A 12/16/2015   Procedure: CESAREAN SECTION;  Surgeon: Dian Queen, MD;  Location: [facility];  Service: Obstetrics;  Laterality: N/A;  STAT C Section   . NO PAST SURGERIES      Family history:  Family History  Problem Relation Age of Onset  . Asthma Mother   . Eczema Father   . Allergic rhinitis Father   . Eczema Sister   . Allergic rhinitis Sister   . Eczema Brother   . Allergic rhinitis Brother     Social history: Lives in a home without carpeting with gas heating and central cooling.  No pets in the home.  No concern for water damage, mildew or roaches in the home.  She  is a stay-at-home.  Denies smoking history.   Medication List: Allergies as of 12/10/2018      Reactions   Tylenol [acetaminophen] Nausea And Vomiting   Soap Itching   Floral/Fruity Soap      Medication List       Accurate as of December 10, 2018  1:40 PM. If you have any questions, ask your nurse or doctor.        STOP taking these medications   acetaminophen 500 MG tablet Commonly known as: TYLENOL Stopped by: Larone Kliethermes Larose HiresPatricia Zymiere Trostle, MD     TAKE  these medications   albuterol 108 (90 Base) MCG/ACT inhaler Commonly known as: VENTOLIN HFA Inhale 1-2 puffs into the lungs every 6 (six) hours as needed for wheezing or shortness of breath.   Alive Womens Gummy Chew Chew 1 tablet by mouth daily.   azelastine 0.1 % nasal spray Commonly known as: ASTELIN Place 1 spray into both nostrils 2 (two) times daily. Started by: Rayya Yagi Larose HiresPatricia Elion Hocker, MD   cyanocobalamin 100 MCG tablet Take 100 mcg by mouth daily.   EPINEPHrine 0.3 mg/0.3 mL Soaj injection Commonly known as: Auvi-Q Inject 0.3 mLs (0.3 mg total) into the muscle once for 1 dose. As directed for life-threatening allergic reactions Started by: Kasen Sako Larose HiresPatricia Livi Mcgann, MD   ferrous sulfate 325 (65 FE) MG tablet Take 325 mg by mouth daily with breakfast.   fexofenadine 180 MG tablet Commonly known as: ALLEGRA Take 180 mg by mouth daily.   fluticasone 50 MCG/ACT nasal spray Commonly known as: FLONASE Place 2 sprays into both nostrils daily.   folic acid 1 MG tablet Commonly known as: FOLVITE Take 2 mg by mouth 2 (two) times daily.   levocetirizine 5 MG tablet Commonly known as: XYZAL Take 1 tablet (5 mg total) by mouth every evening. Started by: Tennelle Taflinger Larose HiresPatricia Journe Hallmark, MD   Probiotic 250 MG Caps Take 1 tablet by mouth daily.   pyridOXINE 100 MG tablet Commonly known as: VITAMIN B-6 Take 100 mg by mouth daily.       Known medication allergies: Allergies  Allergen Reactions  . Tylenol [Acetaminophen] Nausea And Vomiting  . Soap Itching    Floral/Fruity Soap     Physical examination: Blood pressure 102/68, pulse 74, temperature 97.7 F (36.5 C), resp. rate 14, height 5\' 2"  (1.575 m), weight 193 lb 12.8 oz (87.9 kg), last menstrual period 11/27/2018, SpO2 98 %, unknown if currently breastfeeding.  General: Alert, interactive, in no acute distress. HEENT: PERRLA, TMs pearly gray, turbinates moderately edematous with clear discharge, post-pharynx non  erythematous with mild cobblestoning. Neck: Supple without lymphadenopathy. Lungs: Clear to auscultation without wheezing, rhonchi or rales. {no increased work of breathing. CV: Normal S1, S2 without murmurs. Abdomen: Nondistended, nontender. Skin: hands very dry. Extremities:  No clubbing, cyanosis or edema. Neuro:   Grossly intact.  Diagnositics/Labs:  Spirometry: FEV1: 2.04L 80%, FVC: 2.35L 79%, ratio consistent with very slight reduction in FVC otherwise normal study  Allergy testing: Environmental allergy skin prick testing is positive to grasses, weeds, trees, molds, dust mites, cat, cockroach.   Select food allergy skin prick testing is positive to soybean, sesame and shellfish mix. Allergy testing results were read and interpreted by provider, documented by clinical staff.   Assessment and plan:   Allergic rhinitis - environmental allergy skin testing is positive to grasses, weeds, trees, molds, dust mites, cat, cockroach - allergen avoidance measures discussed/hanouts provided - start Astelin, nasal antihistamine, 1 spray each nostril twice a day  to help with nasal drainage and throat clearing - start Xyzal 5mg  daily.  This is a long-acting antihistamine that replaces zyrtec, allegra or claritin if effective.   - allergen immunotherapy discussed today including protocol, benefits and risk.  Informational handout provided.  If interested in this therapuetic option you can check with your insurance carrier for coverage.  Let us know if you would like to proceed with this option.   Allergic asthma  - have access to albuterol inhaler 2 puffs every 4-6 hours as needed for cough/wheeze/shortness of breath/chest tightness.  May use 15-20 minutes prior to activity.   Monitor frequency of use.    Adverse food reaction  - skin testing for common foods and fruits are positive to sesame, soybean and shellfish  - if you eat sesame, soybean or shellfish products without any issue then can  keep in the diet and you are just sensitized to the food.   If you have had symptoms or reaction to these foods then you are allergic and should avoid.     - will provide with epinephrine device (Epipen or AuviQ) to have in case of an allergic reaction and follow emergency action plan.    - you do have Pollen Food Allergy Syndrome or Oral Allergy Syndrome.   The oral allergy syndrome (OAS) or pollen-food allergy syndrome (PFAS) is a relatively common form of food allergy, particularly in adults. It typically occurs in people who have pollen allergies when the immune system "sees" proteins on the food that look like proteins on the pollen. This results in the allergy antibody (IgE) binding to the food instead of the pollen. Patients typically report itching and/or mild swelling of the mouth and throat immediately following ingestion of certain uncooked fruits (including nuts) or raw vegetables. Only a very small number of affected individuals experience systemic allergic reactions, such as anaphylaxis which occurs with true food allergies.     Follow-up 4 months or sooner if needed  I appreciate the opportunity to take part in Keali's care. Please do not hesitate to contact me with questions.  Sincerely,   Margo AyeShaylar Madlyn Crosby, MD Allergy/Immunology [facility]

## 2018-12-10 NOTE — Patient Instructions (Addendum)
Allergies - environmental allergy skin testing is positive to grasses, weeds, trees, molds, dust mites, cat, cockroach - allergen avoidance measures discussed/hanouts provided - start Astelin, nasal antihistamine, 1 spray each nostril twice a day to help with nasal drainage and throat clearing - start Xyzal 5mg  daily.  This is a long-acting antihistamine that replaces zyrtec, allegra or claritin if effective.   - allergen immunotherapy discussed today including protocol, benefits and risk.  Informational handout provided.  If interested in this therapuetic option you can check with your insurance carrier for coverage.  Let us know if you would like to proceed with this option.   Allergic asthma  - have access to albuterol inhaler 2 puffs every 4-6 hours as needed for cough/wheeze/shortness of breath/chest tightness.  May use 15-20 minutes prior to activity.   Monitor frequency of use.    Adverse food reaction  - skin testing for common foods and fruits are positive to sesame, soybean and shellfish  - if you eat sesame, soybean or shellfish products without any issue then can keep in the diet and you are just sensitized to the food.   If you have had symptoms or reaction to these foods then you are allergic and should avoid.     - will provide with epinephrine device (Epipen or AuviQ) to have in case of an allergic reaction and follow emergency action plan.    - you do have Pollen Food Allergy Syndrome or Oral Allergy Syndrome.   The oral allergy syndrome (OAS) or pollen-food allergy syndrome (PFAS) is a relatively common form of food allergy, particularly in adults. It typically occurs in people who have pollen allergies when the immune system "sees" proteins on the food that look like proteins on the pollen. This results in the allergy antibody (IgE) binding to the food instead of the pollen. Patients typically report itching and/or mild swelling of the mouth and throat immediately following ingestion  of certain uncooked fruits (including nuts) or raw vegetables. Only a very small number of affected individuals experience systemic allergic reactions, such as anaphylaxis which occurs with true food allergies.     Follow-up 4 months or sooner if needed

## 2018-12-14 ENCOUNTER — Telehealth (INDEPENDENT_AMBULATORY_CARE_PROVIDER_SITE_OTHER): Payer: 59 | Admitting: Neurology

## 2018-12-14 ENCOUNTER — Encounter: Payer: Self-pay | Admitting: Neurology

## 2018-12-14 ENCOUNTER — Telehealth: Payer: Self-pay

## 2018-12-14 DIAGNOSIS — R29898 Other symptoms and signs involving the musculoskeletal system: Secondary | ICD-10-CM | POA: Diagnosis not present

## 2018-12-14 DIAGNOSIS — M791 Myalgia, unspecified site: Secondary | ICD-10-CM | POA: Diagnosis not present

## 2018-12-14 NOTE — Telephone Encounter (Signed)
12/14/18 - LVM to schedule NCV / EMG testing. Advised pt to call back and schedule

## 2018-12-14 NOTE — Telephone Encounter (Signed)
I called pt, scheduled her for her 2 month follow up. Reminded her to come during office hours for blood work. Will send this message to our schedulers for EMG/NCV scheduling. Pt verbalized understanding of new appt date and time and recommendations.

## 2018-12-14 NOTE — Progress Notes (Signed)
Star Age, MD, PhD [facility] [address] [place], [place] [address]   Virtual Visit via Video Note on 12/14/2018:  I connected with Erin Lester on 12/14/18 at  2:30 PM EDT by a video enabled telemedicine application and verified that I am speaking with the correct person using two identifiers.   I discussed the limitations of evaluation and management by telemedicine and the availability of in person appointments. The patient expressed understanding and agreed to proceed.  History of Present Illness: Erin Lester is a 33 year old right-handed woman with an underlying medical history of palpitations, obesity, anemia and allergies, who presents for a virtual, video based appointment via [place] video visit, for evaluation of her myalgias and weakness.  The patient is unaccompanied today and joins via cell phone from home, I am located in my office.  I have seen her once last year for evaluation for sleep disordered breathing, at that time she was referred by her cardiologist.  She had a negative home sleep test for sleep apnea on 05/31/2018 with an AHI of 1.8/h, O2 nadir of 92%.  She reports that since about mid March she has had intermittent muscle pain and weakness.  It is more so in the right than the left extremities but in the upper and lower extremities and all 4 extremities are affected.  It seems to start in the right proximal arm in the deltoid or bicep area with a transient ache and weakness feeling.  She never had sudden onset of one-sided weakness, numbness, or droopy face.  She denies any fevers.  She has had body aches and chills.  She has not fallen or dropped anything. She is referred by her primary care provider, Christa See, NP and I reviewed her office note from 12/04/2018 which was a tele-visit.  The patient reports that she had recent blood work On 11/26/2018, she pulled up her blood test results on her phone during this appointment and  read some of the results to me.  She reports her ESR was 41, TSH 1.47, B12 705, folate 15.3.  She reports that sometimes when she looks to 1 side her vision changes, she sees stars.  She has prescription eyeglasses but has not had an eye appointment in 2 to 3 years. The patient presented to the emergency room recently on 10/08/2018 with cough and flulike symptoms including myalgias.  Her cough and shortness of breath had improved but she had persistent myalgias.  Neurological examination in the emergency room at that time was unremarkable per ER records, she was treated symptomatically, had blood work and IV fluids. She was suspected to have symptoms of COVID-19.  She presented to the emergency room on 10/17/2018 with cough, subjective fever, diarrhea and myalgias.  Previously:   05/06/2018: 33 year old right-handed woman with an underlying medical history of anemia, allergies, palpitations and obesity, who reports snoring and excessive daytime somnolence. I reviewed your office note from 03/09/2018, which you kindly included. Her Epworth sleepiness score is 9 out of 24 today, fatigue score is 48 of 63. She is married and lives with her husband and her son, age 74. She currently does not work. She used to work as a Development worker, community carrier. She is a nonsmoker and does not utilize alcohol on a regular basis, does not drink caffeine daily. Her bedtime is typically between 9 and 10 but she has some difficulty falling asleep. She has had palpitations and chest discomfort, some lightheadedness reported. She is scheduled  for an echocardiogram next month as I understand. She does not have night to night nocturia or morning headaches. She has no known family history of sleep apnea. Her husband has reported witnessed apneas to her when she asked him recently. Her weight has increased.   Her Past Medical History Is Significant For: Past Medical History:  Diagnosis Date  . Allergy   . Anemia   . Eczema   . Irregular menses    . Low iron   . Macromastia 05/2012    Her Past Surgical History Is Significant For: Past Surgical History:  Procedure Laterality Date  . BREAST REDUCTION SURGERY  06/08/2012   Procedure: MAMMARY REDUCTION  (BREAST);  Surgeon: Cristine Polio, MD;  Location: [facility];  Service: Plastics;  Laterality: Bilateral;  . BREAST SURGERY    . CESAREAN SECTION N/A 12/16/2015   Procedure: CESAREAN SECTION;  Surgeon: Dian Queen, MD;  Location: [facility];  Service: Obstetrics;  Laterality: N/A;  STAT C Section   . NO PAST SURGERIES      Her Family History Is Significant For: Family History  Problem Relation Age of Onset  . Asthma Mother   . Eczema Father   . Allergic rhinitis Father   . Eczema Sister   . Allergic rhinitis Sister   . Eczema Brother   . Allergic rhinitis Brother     Her Social History Is Significant For: Social History   Socioeconomic History  . Marital status: Married    Spouse name: Not on file  . Number of children: Not on file  . Years of education: Not on file  . Highest education level: Not on file  Occupational History  . Not on file  Social Needs  . Financial resource strain: Not on file  . Food insecurity    Worry: Not on file    Inability: Not on file  . Transportation needs    Medical: Not on file    Non-medical: Not on file  Tobacco Use  . Smoking status: Never Smoker  . Smokeless tobacco: Never Used  Substance and Sexual Activity  . Alcohol use: Yes    Comment: seldom  . Drug use: No  . Sexual activity: Yes    Birth control/protection: Condom, None  Lifestyle  . Physical activity    Days per week: Not on file    Minutes per session: Not on file  . Stress: Not on file  Relationships  . Social Herbalist on phone: Not on file    Gets together: Not on file    Attends religious service: Not on file    Active member of club or organization: Not on file    Attends meetings of clubs or organizations:  Not on file    Relationship status: Not on file  Other Topics Concern  . Not on file  Social History Narrative  . Not on file    Her Allergies Are:  Allergies  Allergen Reactions  . Tylenol [Acetaminophen] Nausea And Vomiting  . Soap Itching    Floral/Fruity Soap  :   Her Current Medications Are:  Outpatient Encounter Medications as of 12/14/2018  Medication Sig  . albuterol (VENTOLIN HFA) 108 (90 Base) MCG/ACT inhaler Inhale 1-2 puffs into the lungs every 6 (six) hours as needed for wheezing or shortness of breath.   Marland Kitchen azelastine (ASTELIN) 0.1 % nasal spray Place 1 spray into both nostrils 2 (two) times daily.  . cyanocobalamin 100 MCG tablet  Take 100 mcg by mouth daily.  . ferrous sulfate 325 (65 FE) MG tablet Take 325 mg by mouth daily with breakfast.  . fexofenadine (ALLEGRA) 180 MG tablet Take 180 mg by mouth daily.  . fluticasone (FLONASE) 50 MCG/ACT nasal spray Place 2 sprays into both nostrils daily.  . folic acid (FOLVITE) 1 MG tablet Take 2 mg by mouth 2 (two) times daily.   Marland Kitchen levocetirizine (XYZAL) 5 MG tablet Take 1 tablet (5 mg total) by mouth every evening.  . Multiple Vitamins-Minerals (ALIVE WOMENS GUMMY) CHEW Chew 1 tablet by mouth daily.  Marland Kitchen pyridOXINE (VITAMIN B-6) 100 MG tablet Take 100 mg by mouth daily.  . Saccharomyces boulardii (PROBIOTIC) 250 MG CAPS Take 1 tablet by mouth daily.   No facility-administered encounter medications on file as of 12/14/2018.   :   Review of Systems:  Out of a complete 14 point review of systems, all are reviewed and negative with the exception of these symptoms as listed below:  Observations/Objective: On examination she is very pleasant and conversant, in no acute distress, good comprehension and language skills, speech is clear without dysarthria, hypophonia or voice tremor noted.  Neck mobility intact in all directions, shoulder height equal, shoulder shrug symmetrical.  HEENT exam reveals normal eye movements in all  directions without limitation to gaze, disconjugate gaze, or nystagmus noted.  She has normal facial animation, symmetrical facial grimacing.  Airway examination reveals mild mouth dryness, tongue protrudes centrally in palate elevates symmetrically.  Hearing is grossly intact.  Motor examination reveals normal-appearing muscle bulk, no drift, no postural or action tremor is noted.  Romberg is negative.  Fine motor skills with finger taps and hand movements are good.  Cerebellar testing with finger-to-nose testing shows no dysmetria or intention tremor.  She stands up without difficulty, posture is normal.  She walks without difficulty with preserved arm swing, no limp.  Tandem walk is unremarkable.  Assessment and Plan:  In summary, Erin Lester is a very pleasant 33 y.o.-year old female Erin Lester is a 33 year old right-handed woman with an underlying medical history of palpitations, obesity, anemia and allergies, who presents for a virtual, video based appointment via [place] video visit, for evaluation of her myalgias and weakness. On today's albeit limited video based appointment and examination she has a nonfocal exam, in particular, no muscle wasting is noted, no involuntary movements are noted, no obvious drift or fine motor difficulties.  She is reassured in that regard, nevertheless, in an office examination would be helpful.  We will arrange for a follow-up in the office in a couple of months, sooner if needed.  We will do some additional blood work including ANA, RPR, vitamin D, CK level.  She is invited to come to our office for blood work anytime during work hours/office hours here.  In addition I suggested we proceed with EMG nerve. We will keep her posted as to her test results by phone call as well.  She is advised that this symptomatology could very well be from an underlying viral syndrome that she may have had starting in March.  Nevertheless, further testing from our end of things  is feasible.  I answered all her questions today and she was in agreement with the plan.    Follow Up Instructions:    I discussed the assessment and treatment plan with the patient. The patient was provided an opportunity to ask questions and all were answered. The patient agreed with the plan and  demonstrated an understanding of the instructions.   The patient was advised to call back or seek an in-person evaluation if the symptoms worsen or if the condition fails to improve as anticipated.  I provided 30 minutes of non-face-to-face time during this encounter.   Star Age, MD

## 2018-12-17 ENCOUNTER — Other Ambulatory Visit: Payer: Self-pay

## 2018-12-17 ENCOUNTER — Other Ambulatory Visit (INDEPENDENT_AMBULATORY_CARE_PROVIDER_SITE_OTHER): Payer: Self-pay

## 2018-12-17 DIAGNOSIS — Z0289 Encounter for other administrative examinations: Secondary | ICD-10-CM

## 2018-12-19 LAB — HEAVY METALS PROFILE II, BLOOD
Arsenic: 7 ug/L (ref 2–23)
Cadmium: NOT DETECTED ug/L (ref 0.0–1.2)
Lead, Blood: NOT DETECTED ug/dL (ref 0–4)
Mercury: NOT DETECTED ug/L (ref 0.0–14.9)

## 2018-12-19 LAB — RPR: RPR Ser Ql: NONREACTIVE

## 2018-12-19 LAB — ANA W/REFLEX: Anti Nuclear Antibody (ANA): NEGATIVE

## 2018-12-19 LAB — CK: Total CK: 97 U/L (ref 32–182)

## 2018-12-19 LAB — C-REACTIVE PROTEIN: CRP: 37 mg/L — ABNORMAL HIGH (ref 0–10)

## 2018-12-19 LAB — VITAMIN D 25 HYDROXY (VIT D DEFICIENCY, FRACTURES): Vit D, 25-Hydroxy: 31.7 ng/mL (ref 30.0–100.0)

## 2018-12-19 LAB — RHEUMATOID FACTOR: Rheumatoid fact SerPl-aCnc: <10 IU/mL (ref 0.0–13.9)

## 2018-12-23 ENCOUNTER — Telehealth: Payer: Self-pay

## 2018-12-23 NOTE — Telephone Encounter (Signed)
Pt returning call please call back °

## 2018-12-23 NOTE — Progress Notes (Signed)
Please call pt:  Labs were fine, with the exception of vitamin D at the low end of normal. She should consider taking an OTC Vitamin D supplement: 1000-2000 units daily of any vitamin D supplement of Her choice should be fine. I would recommend recheck of vitamin D status in 3-6 months with PCP.  Her CRP was elevated: this is a non-specific inflammatory marker, and can be elevated transiently due to inflammation or infection. She is advised to monitor her symptoms; we will proceed with EMG as scheduled for later this month. Michel Bickers

## 2018-12-23 NOTE — Telephone Encounter (Signed)
-----   Message from Star Age, MD sent at 12/23/2018  8:12 AM EDT ----- Please call pt:  Labs were fine, with the exception of vitamin D at the low end of normal. She should consider taking an OTC Vitamin D supplement: 1000-2000 units daily of any vitamin D supplement of Her choice should be fine. I would recommend recheck of vitamin D status in 3-6 months with PCP.  Her CRP was elevated: this is a non-specific inflammatory marker, and can be elevated transiently due to inflammation or infection. She is advised to monitor her symptoms; we will proceed with EMG as scheduled for later this month. Michel Bickers

## 2018-12-23 NOTE — Telephone Encounter (Signed)
I called pt to discuss her lab results. No answer, left a message asking her to call me back. 

## 2018-12-23 NOTE — Telephone Encounter (Signed)
I called pt, discussed her lab results and recommendations. Pt is agreeable to this plan. Pt verbalized understanding of results. Pt had no questions at this time but was encouraged to call back if questions arise.

## 2019-01-20 ENCOUNTER — Ambulatory Visit (INDEPENDENT_AMBULATORY_CARE_PROVIDER_SITE_OTHER): Payer: 59 | Admitting: Neurology

## 2019-01-20 ENCOUNTER — Telehealth: Payer: Self-pay

## 2019-01-20 ENCOUNTER — Encounter: Payer: Self-pay | Admitting: Neurology

## 2019-01-20 ENCOUNTER — Other Ambulatory Visit: Payer: Self-pay

## 2019-01-20 DIAGNOSIS — R29898 Other symptoms and signs involving the musculoskeletal system: Secondary | ICD-10-CM | POA: Diagnosis not present

## 2019-01-20 DIAGNOSIS — M791 Myalgia, unspecified site: Secondary | ICD-10-CM

## 2019-01-20 HISTORY — DX: Myalgia, unspecified site: M79.10

## 2019-01-20 NOTE — Telephone Encounter (Signed)
I called pt, advised her that the EMG/NCV was normal. Pt verbalized understanding of results. Pt had no questions at this time but was encouraged to call back if questions arise.

## 2019-01-20 NOTE — Progress Notes (Signed)
Please call and advise the patient that the recent EMG and nerve conduction velocity test, which is the electrical nerve and muscle test we we performed, was reported as within normal limits. We checked for abnormal electrical discharges in the muscles or nerves and the report suggested normal findings. No further action is required on this test at this time. Please remind patient to keep any upcoming appointments or tests and to call us with any interim questions, concerns, problems or updates. Thanks,  Julietta Batterman, MD, PhD 

## 2019-01-20 NOTE — Progress Notes (Signed)
[place]    Nerve / Sites Muscle Latency Ref. Amplitude Ref. Rel Amp Segments Distance Velocity Ref. Area    ms ms mV mV %  cm m/s m/s mVms  R Median - APB     Wrist APB 2.8 ?4.4 7.0 ?4.0 100 Wrist - APB 7   20.8     Upper arm APB 6.0  6.9  98 Upper arm - Wrist 19 59 ?49 21.1  R Ulnar - ADM     Wrist ADM 2.2 ?3.3 7.2 ?6.0 100 Wrist - ADM 7   19.0     B.Elbow ADM 4.8  7.6  106 B.Elbow - Wrist 17 65 ?49 20.1     A.Elbow ADM 6.1  7.4  96.8 A.Elbow - B.Elbow 10 77 ?49 20.1         A.Elbow - Wrist      R Peroneal - EDB     Ankle EDB 3.9 ?6.5 5.5 ?2.0 100 Ankle - EDB 9   13.1     Fib head EDB 8.8  5.2  94.1 Fib head - Ankle 28 57 ?44 12.7     Pop fossa EDB 10.8  5.1  99.1 Pop fossa - Fib head 10 49 ?44 13.3         Pop fossa - Ankle      R Tibial - AH     Ankle AH 3.8 ?5.8 11.5 ?4.0 100 Ankle - AH 9   18.1     Pop fossa AH 10.3  7.2  62.4 Pop fossa - Ankle 35 54 ?41 15.0             SNC    Nerve / Sites Rec. Site Peak Lat Ref.  Amp Ref. Segments Distance    ms ms V V  cm  R Sural - Ankle (Calf)     Calf Ankle 2.9 ?4.4 10 ?6 Calf - Ankle 14  R Superficial peroneal - Ankle     Lat leg Ankle 3.3 ?4.4 7 ?6 Lat leg - Ankle 14  R Median - Orthodromic (Dig II, Mid palm)     Dig II Wrist 2.4 ?3.4 10 ?10 Dig II - Wrist 13  R Ulnar - Orthodromic, (Dig V, Mid palm)     Dig V Wrist 2.3 ?3.1 9 ?5 Dig V - Wrist 74              F  Wave    Nerve F Lat Ref.   ms ms  R Tibial - AH 41.4 ?56.0  R Ulnar - ADM 22.8 ?32.0

## 2019-01-20 NOTE — Progress Notes (Signed)
Please refer to EMG and nerve conduction procedure note.  

## 2019-01-20 NOTE — Procedures (Signed)
     HISTORY:  Erin Lester is a 33 year old patient who reported onset of diffuse myalgias and burning sensations in the muscle that will undulate in severity.  The issue began in March 2020 following what sounded like a viral illness.  The patient has had intermittent symptoms since that time.  The patient is being evaluated for possible neuropathy or a myopathic disorder.  NERVE CONDUCTION STUDIES:  Nerve conduction studies were performed on the right upper extremity. The distal motor latencies and motor amplitudes for the median and ulnar nerves were within normal limits. The nerve conduction velocities for these nerves were also normal. The sensory latencies for the median and ulnar nerves were normal. The F wave latency for the ulnar nerve was within normal limits.  Nerve conduction studies were performed on the right lower extremity. The distal motor latencies and motor amplitudes for the peroneal and posterior tibial nerves were within normal limits. The nerve conduction velocities for these nerves were also normal. The sensory latencies for the peroneal and sural nerves were within normal limits. The F wave latency for the posterior tibial nerve was within normal limits.   EMG STUDIES:  EMG study was performed on the right lower extremity:  The tibialis anterior muscle reveals 2 to 4K motor units with full recruitment. No fibrillations or positive waves were seen. The peroneus tertius muscle reveals 2 to 4K motor units with full recruitment. No fibrillations or positive waves were seen. The medial gastrocnemius muscle reveals 1 to 3K motor units with full recruitment. No fibrillations or positive waves were seen. The vastus lateralis muscle reveals 2 to 4K motor units with full recruitment. No fibrillations or positive waves were seen. The iliopsoas muscle reveals 2 to 4K motor units with full recruitment. No fibrillations or positive waves were seen. The biceps femoris muscle (long  head) reveals 2 to 4K motor units with full recruitment. No fibrillations or positive waves were seen. The lumbosacral paraspinal muscles were tested at 3 levels, and revealed no abnormalities of insertional activity at all 3 levels tested. There was good relaxation.   IMPRESSION:  Nerve conduction studies were performed on the right upper and right lower extremities.  These studies were completely within normal limits, no evidence of a neuropathy is seen.  EMG evaluation of the right lower extremity was normal, there is no evidence of a myopathic disorder or evidence of lumbosacral radiculopathy.  Jill Alexanders MD 01/20/2019 4:13 PM  [facility] [address] [place], [place] [address]  Phone [phone] Fax [phone]

## 2019-01-20 NOTE — Telephone Encounter (Signed)
-----   Message from Star Age, MD sent at 01/20/2019  4:48 PM EDT ----- Please call and advise the patient that the recent EMG and nerve conduction velocity test, which is the electrical nerve and muscle test we we performed, was reported as within normal limits. We checked for abnormal electrical discharges in the muscles or nerves and the report suggested normal findings. No further action is required on this test at this time. Please remind patient to keep any upcoming appointments or tests and to call us with any interim questions, concerns, problems or updates. Thanks,  Star Age, MD, PhD

## 2019-02-16 ENCOUNTER — Ambulatory Visit: Payer: Self-pay | Admitting: Neurology

## 2019-04-06 ENCOUNTER — Ambulatory Visit: Payer: Self-pay | Admitting: Neurology

## 2019-04-06 ENCOUNTER — Telehealth: Payer: Self-pay

## 2019-04-06 NOTE — Telephone Encounter (Signed)
Pt did not show for their appt with Dr. Athar today.  

## 2019-04-07 ENCOUNTER — Encounter: Payer: Self-pay | Admitting: Neurology

## 2019-11-08 ENCOUNTER — Other Ambulatory Visit: Payer: Self-pay | Admitting: Obstetrics and Gynecology

## 2019-11-12 ENCOUNTER — Ambulatory Visit: Payer: 59

## 2019-11-29 ENCOUNTER — Ambulatory Visit
Payer: No Typology Code available for payment source | Attending: Obstetrics and Gynecology | Admitting: Obstetrics and Gynecology

## 2019-11-29 ENCOUNTER — Ambulatory Visit: Payer: No Typology Code available for payment source | Admitting: *Deleted

## 2019-11-29 ENCOUNTER — Other Ambulatory Visit: Payer: Self-pay

## 2019-11-29 DIAGNOSIS — D573 Sickle-cell trait: Secondary | ICD-10-CM | POA: Diagnosis not present

## 2019-11-29 DIAGNOSIS — Z3A27 27 weeks gestation of pregnancy: Secondary | ICD-10-CM | POA: Diagnosis not present

## 2019-11-29 DIAGNOSIS — O34219 Maternal care for unspecified type scar from previous cesarean delivery: Secondary | ICD-10-CM | POA: Diagnosis present

## 2019-11-29 DIAGNOSIS — O99012 Anemia complicating pregnancy, second trimester: Secondary | ICD-10-CM | POA: Diagnosis not present

## 2019-11-29 DIAGNOSIS — Z3169 Encounter for other general counseling and advice on procreation: Secondary | ICD-10-CM

## 2019-11-29 DIAGNOSIS — Z8759 Personal history of other complications of pregnancy, childbirth and the puerperium: Secondary | ICD-10-CM | POA: Diagnosis not present

## 2019-11-29 NOTE — Progress Notes (Signed)
Pt here to see Dr. Judeth Cornfield in [facility] for preconception consult. LMP 10-16-19, pt states she is late with her cycle.  UPT on 11-25-19 was negative. BP 121/70 HR81.

## 2019-11-29 NOTE — Progress Notes (Signed)
Maternal-Fetal Medicine   Name: Erin Lester, Erin Lester DOB: 1986-05-24 MRN: [id number] Referring Provider: Servando Salina, MD  I had the pleasure of seeing Erin Lester today at the [facility] for maternal fetal care.  She is G0101 and is here for preconception consultation because of her pregnancy complication of placental abruption.   Obstetric history is significant for a preterm cesarean delivery in June 2017 of a female infant weighing 2 pounds at birth.  Placental abruption was diagnosed before delivery by ultrasound and on clinical grounds and it was confirmed at surgery.  Her pregnancy had been uneventful till the day of admission when she had severe abdominal pain for a total of 5 hours.  On evaluation at the [facility], her blood pressure was 134/63 mmHg and pulse rate was 88/min.  Ultrasound confirmed a large blood clot consistent with abruption.  BPP score was 2/8.  Breech presentation was seen.  Amniotic fluid was normal.  A stat low transverse cesarean section was performed a large retroplacental hematoma (50%) was seen.  Amniotic fluid was clear.  Patient had postoperative anemia (hemoglobin 6.1%) but no blood transfusion was given.  Patient denies history of hypertension or trauma or cocaine use or cigarette smoking. Her son has cerebral palsy and had intracranial hemorrhage after birth (prematurity).  Gyn history: Cycles 4-5/28-32 days with some irregular cycles. No history of abnormal Pap smears or cervical surgeries. No history of breast disease. She had breast reduction procedure. Patient is not using any contraceptives and is not able to get pregnant.  Past medical history: No history of hypertension or diabetes or any other chronic medical conditions.  Patient reports she has sickle cell trait (no results available in her chart). Past surgical history: Breast reduction (2015), cesarean section (2017). Medications folic acid, weekly MIC injections (for weight loss). Allergies: Tylenol  (dizziness, nausea). Social history: Denies tobacco, drug, or alcohol use.  She is married and her husband (African-American) is in good health.  He is the father of her first child.  His sickle cell carrier status is not known. Family history: No history of venous thromboembolism in the family.  Our concerns include: History of placental abruption I discussed the risk factors predisposing to placental abruption. Patient did not have or has any clear identifiable risk factors including smoking, cocaine use, hypertension, trauma, or infections. Her pregnancy had been uneventful till diagnosis. Association with acquired or inherited thrombophilia has not been conclusively proven, and, therefore, thrombophilia work-up is not indicated.  Placental histology was unremarkable. However, placental abruption is a clinical diagnosis. She had symptoms of severe abdominal pain and ultrasound raised suspicion of abruption that was confirmed at cesarean section.  I informed the patient that all these findings strongly suggest that she had placental abruption (concealed abruption).   I discussed the recurrence rate of placental abruption. In a large population-based study from birth registry in [place] it was found that mild abruption has a recurrence rate of 6.5% and severe 11.5%. Overall, a recurrence risk of 6% to 10% could be expected. I also reassured her of the absence of clear factors for abruption. We will closely monitor pregnancy for symptoms of vaginal bleeding or hypertension. If vaginal bleeding or spotting occurs in late gestation, hospitalization may be advised. Antenatal testing (BPP) will not be able to predict placental abruption  In the absence of symptoms of vaginal bleeding or abdominal pain, her pregnancy can continue to 39 weeks when the patient should be delivered. If significant maternal anxiety is present (because of her son  affected with cerebral palsy), early term delivery (37 or 38 weeks)  may be considered. I counseled her on the small increased risk of NICU readmissions in infants born at 45 or 38 weeks as opposed to those born at or after 39 weeks.   Sickle-cell trait We do not have information on the type of sickle-cell trait. I discussed genetics and the importance of screening her husband. If he is also a carrier, the chances of the fetus being affected with sickle-cell disease is 1 in 4 (25%). I discussed the availability of prenatal diagnosis (chorionic villus sampling or amniocentesis). I discussed the procedure and its complication of pregnancy loss (1 in 500 procedures). Patient informed that she will have her husband screened for sickle-cell trait. I also informed her that she may choose to meet with our genetic counselor.  Previous cesarean section Operative note mentions low-transverse cesarean section. I discussed VBAC and its possible complication of scar rupture during labor (low 1%). Repeat cesarean deliveries increase the likelihood of placenta previa or accreta.  Preconception I encouraged her to take preconception folic acid regularly to decrease the chances of open-spina bifida in the fetus. It should be taken at least 1 month before conception.  Patient is concerned about her weight and fertility. She is 5'2" tall and reported her weight of 214 lbs (BMI=39.1). She is aware that weight loss will improve fertility. She is not sure of diagnosis of PCOS where metformin is likely to improve fertility. I informed her that weight loss drugs (MIC) should not be taken during pregnancy and they should be discontinued, preferably now.  Recommendations: -Continue preconception folic acid. -Pelvic ultrasound to check ovaries (may give information on PCOS). -Discontinue weight loss drugs if actively trying for pregnancy. -Serial growth assessments every 4 weeks in pregnancy. -Delivery at 39 weeks (or at 37 to 38 weeks if significant maternal anxiety is present). -Earlier  delivery (36 weeks onward) is indicated if patient has unexplained vaginal bleeding. -VBAC counseling by her obstetrician. -Husband to be screened for sickle-cell carrier status ([facility] screening is not to be performed). -Genetic counseling if he has sickle-cell trait.  Thank you for consultation. If you have any questions, please call me at the [facility].  Consultation including face-to-face counseling: 45 minutes.

## 2020-10-11 IMAGING — RF ESOPHAGUS/BARIUM SWALLOW/TABLET STUDY
7 of 8 series · 14 of 24 positions shown · non-contrast
Comparison: 10/17/2018 chest radiograph.

CLINICAL DATA: 32-year-old female with dysphagia with sticking
sensation in the throat and throat clearing.

EXAM:
ESOPHOGRAM / BARIUM SWALLOW / BARIUM TABLET STUDY
TECHNIQUE: Combined double contrast and single contrast examination performed
using effervescent crystals, thick barium liquid, and thin barium
liquid. The patient was observed with fluoroscopy swallowing a 13 mm
barium sulphate tablet.
FLUOROSCOPY TIME:  Fluoroscopy Time:  2 minutes 18 seconds
Radiation Exposure Index (if provided by the fluoroscopic device):
130 mGy
Number of Acquired Spot Images: 7

[Series 1: sequence · 2 of 19 frames shown (1 of 6)]
[frame 3/19]
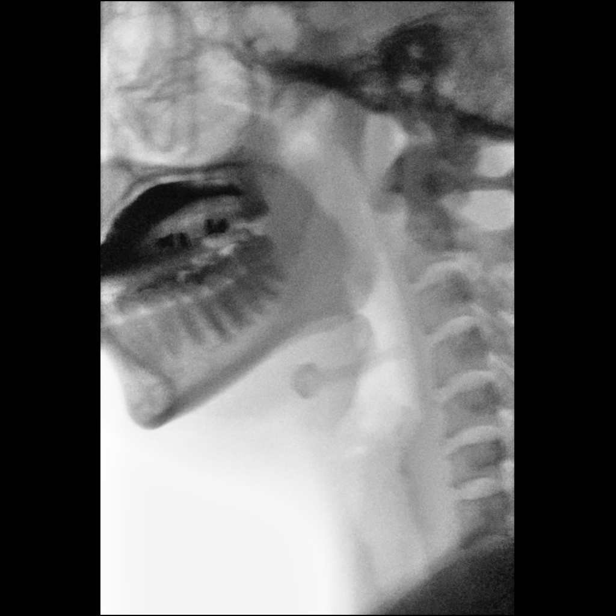
[frame 17/19]
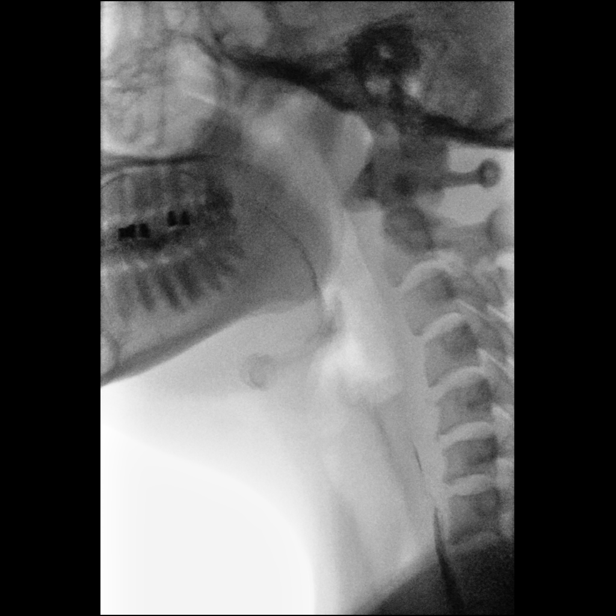

[Series 3: sequence · 1 of 13 frames shown (2 of 6)]
[frame 2/13]
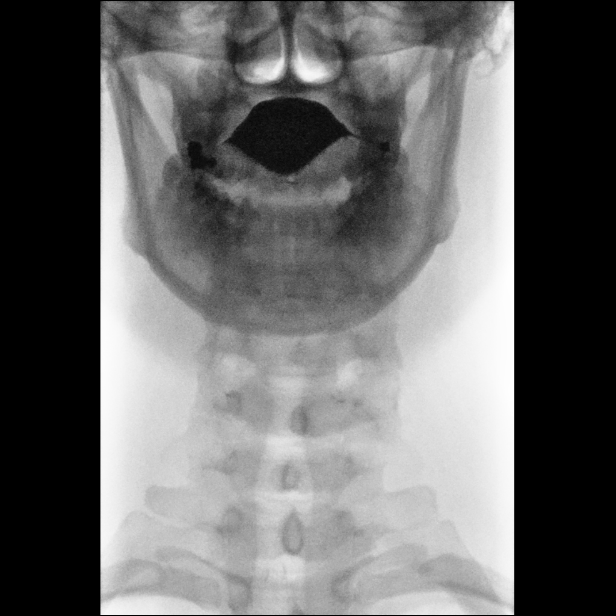

[Series 4: one shot · 0.15mm/px · 4 of 7 slices shown]
[im 1/7]
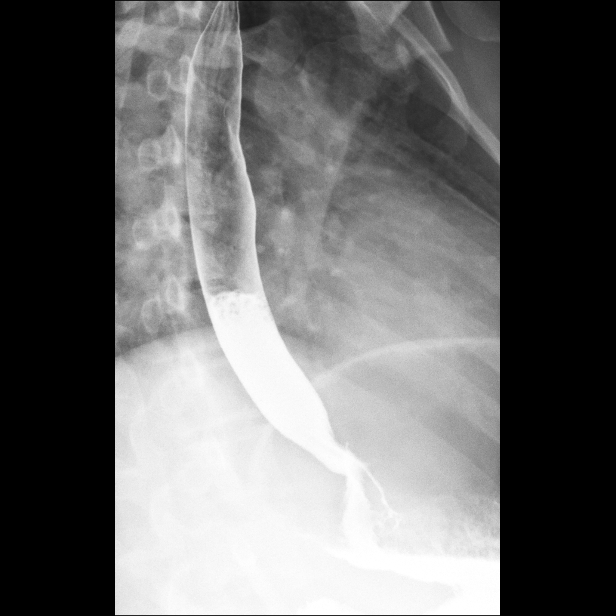
[im 2/7]
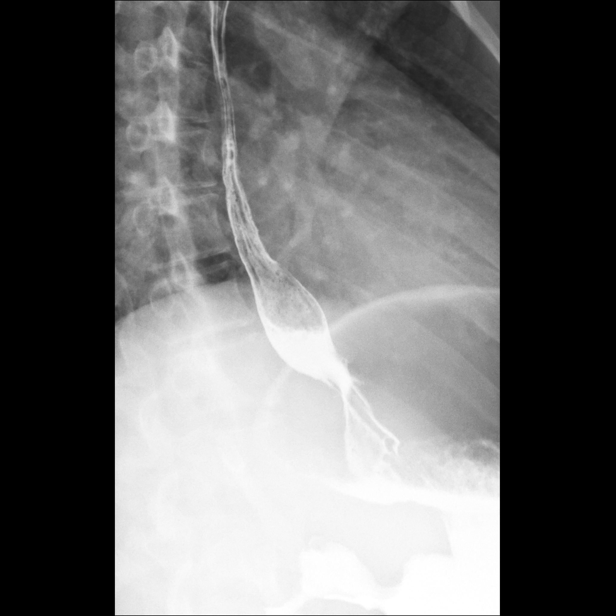
[im 5/7]
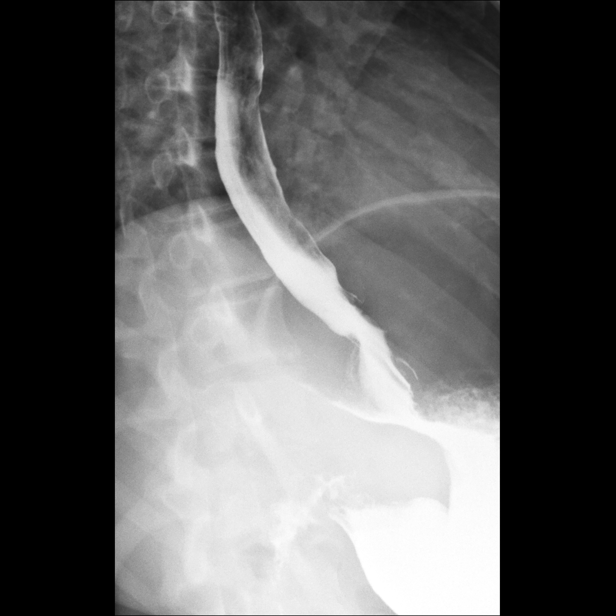
[im 7/7]
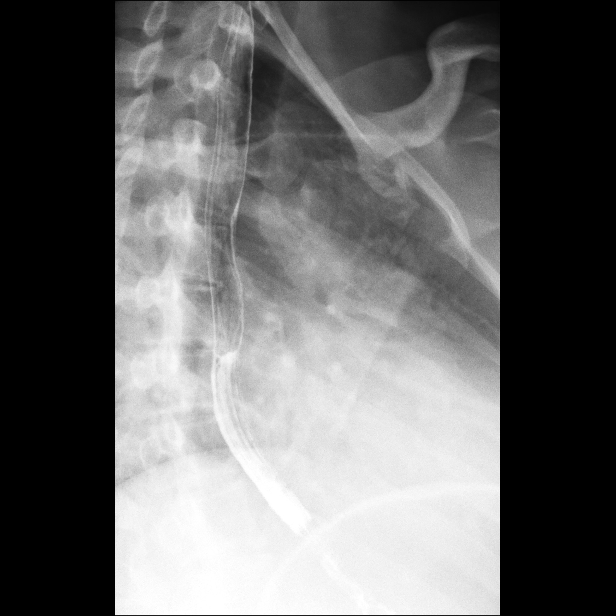

[Series 5: sequence · 2 of 29 frames shown (3 of 6)]
[frame 15/29]
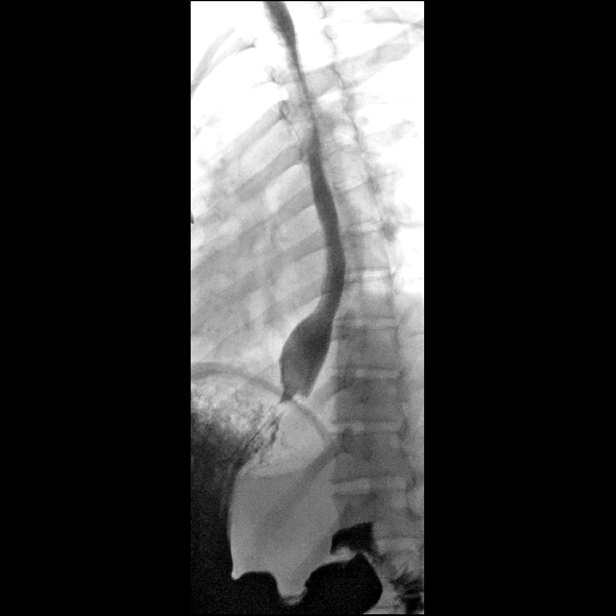
[frame 25/29]
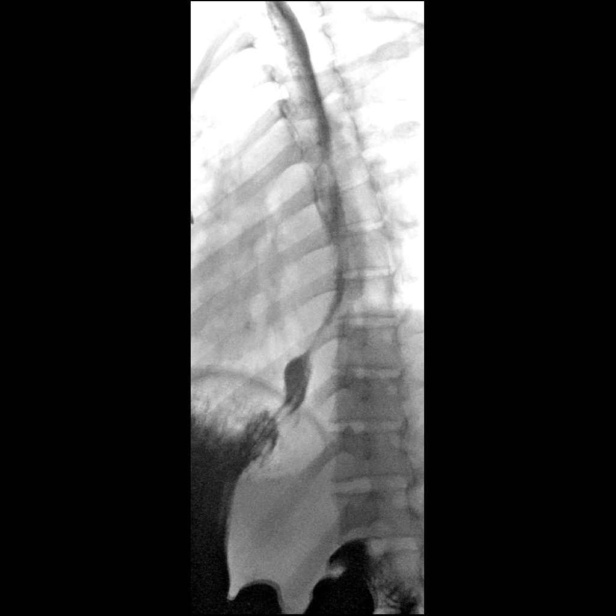

[Series 6: sequence · 1 of 77 frames shown (4 of 6)]
[frame 59/77]
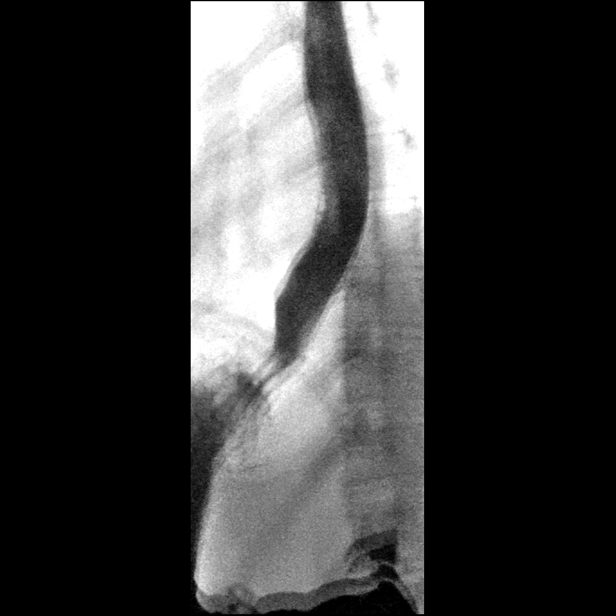

[Series 7: sequence · 2 of 17 frames shown (5 of 6)]
[frame 1/17]
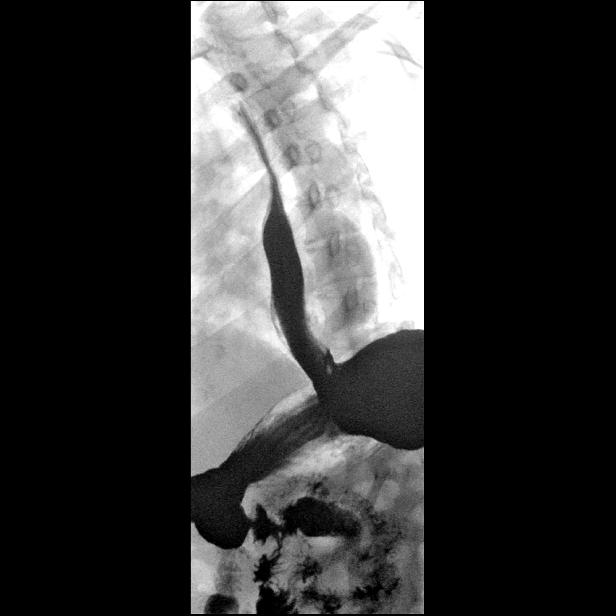
[frame 9/17]
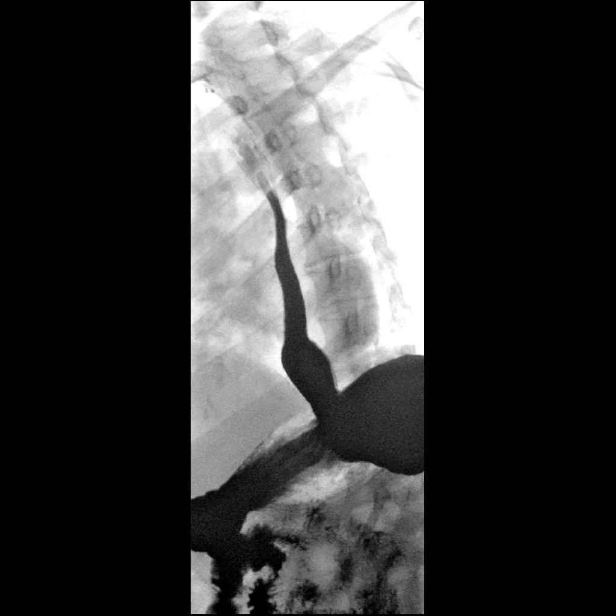

[Series 8: sequence · 2 of 57 frames shown (6 of 6)]
[frame 9/57]
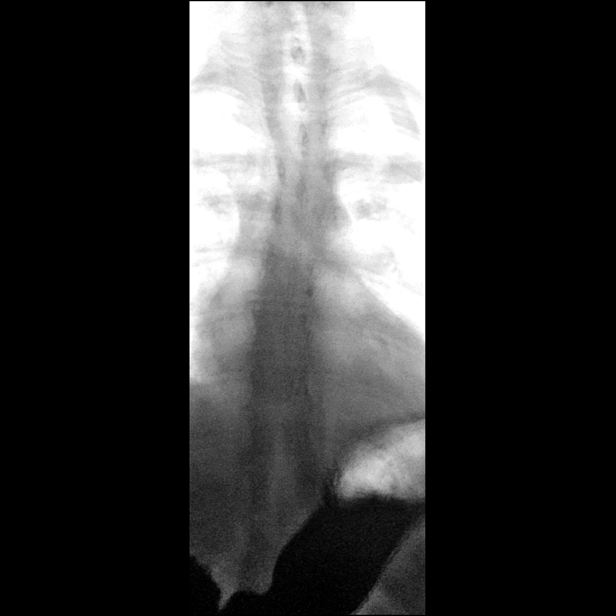
[frame 49/57]
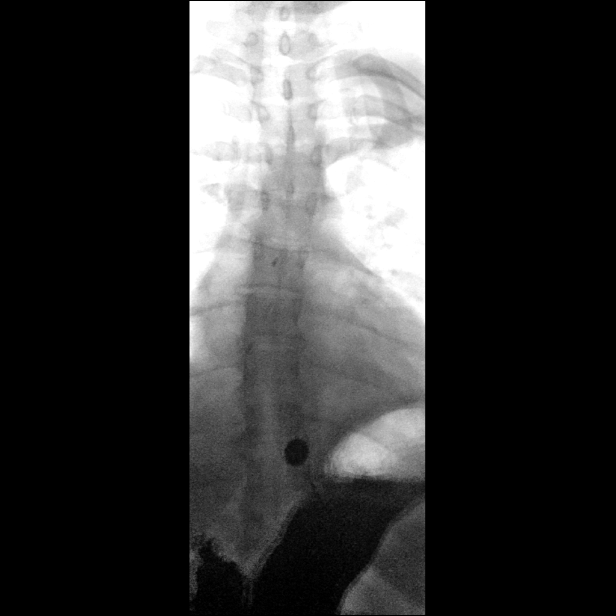

[14 of 24 positions shown; findings below may reference images not displayed]

FINDINGS: Normal oral and pharyngeal phase of swallowing, with no laryngeal
penetration or tracheobronchial aspiration. No evidence of
pharyngeal mass, stricture or diverticulum. No evidence of
cricopharyngeus muscle dysfunction.

Normal esophageal motility. Tiny sliding hiatal hernia. Moderate to
severe gastroesophageal reflux to the level of the thoracic inlet
with water siphon test. Normal esophageal distensibility, with no
evidence of esophageal mass, stricture or ulcer. Normal esophageal
mucosa, with no evidence of reflux esophagitis.
IMPRESSION: 1. Tiny sliding hiatal hernia. Moderate to severe gastroesophageal
reflux elicited.
2. Normal esophageal motility.
3. No evidence of reflux esophagitis. No evidence of esophageal
mass, stricture or ulcer.
4. Normal oral and pharyngeal phases of swallowing.

## 2020-10-15 ENCOUNTER — Emergency Department (HOSPITAL_COMMUNITY)
Admission: EM | Admit: 2020-10-15 | Discharge: 2020-10-16 | Disposition: A | Discharge status: Home / Self Care | Payer: No Typology Code available for payment source | Attending: Emergency Medicine | Admitting: Emergency Medicine

## 2020-10-15 ENCOUNTER — Encounter (HOSPITAL_COMMUNITY): Payer: Self-pay

## 2020-10-15 ENCOUNTER — Other Ambulatory Visit: Payer: Self-pay

## 2020-10-15 DIAGNOSIS — R42 Dizziness and giddiness: Secondary | ICD-10-CM | POA: Diagnosis not present

## 2020-10-15 DIAGNOSIS — R509 Fever, unspecified: Secondary | ICD-10-CM | POA: Diagnosis present

## 2020-10-15 DIAGNOSIS — U071 COVID-19: Secondary | ICD-10-CM | POA: Diagnosis not present

## 2020-10-15 MED ORDER — IBUPROFEN 800 MG PO TABS
800.0000 mg | ORAL_TABLET | Freq: Once | ORAL | Status: AC
  Administered 2020-10-15: 800 mg via ORAL
  Filled 2020-10-15: qty 2

## 2020-10-15 NOTE — ED Triage Notes (Signed)
Sore throat yesterday. Fever, neck, back and hip pain starting today. COVID positive per home test kit.  Pt reports difficulty managing her fevers at home. COVID vaccinated x 2.

## 2020-10-15 NOTE — ED Triage Notes (Signed)
Emergency Medicine Provider Triage Evaluation Note  Erin Lester , a 35 y.o. female  was evaluated in triage.  Pt complains of fever, stiff neck, headache, and dizziness. Patient tested positive for COVID today with an at home antigen test. No chest pain or shortness of breath. She is vaccinated against COVID-19; however has not received her booster shot.   Review of Systems  Positive: Fever, headache Negative: Chest pain, shortness of breath  Physical Exam  BP 103/71 (BP Location: Right Arm)   Pulse (!) 139   Temp (!) 102.6 F (39.2 C) (Oral)   Resp 18   SpO2 100%  Gen:   Awake, no distress   HEENT:  Atraumatic  Resp:  Normal effort  Cardiac:  tachycardic Abd:   Nondistended, nontender  MSK:   Moves extremities without difficulty  Neuro:  Speech clear   Medical Decision Making  Medically screening exam initiated at 9:51 PM.  Appropriate orders placed.  Erin Lester was informed that the remainder of the evaluation will be completed by another provider, this initial triage assessment does not replace that evaluation, and the importance of remaining in the ED until their evaluation is complete.  Clinical Impression  COVID+ patient. No chest pain or shortness of breath. No meningismus to suggest meningitis. Ibuprofen given for fever due to allergy to Tylenol.    Mannie Stabile, [place] 10/15/20 [id number]

## 2020-10-16 NOTE — Discharge Instructions (Signed)
Isolate for an additional 5 days after fever free for 24 hours. Take tylenol for fever, headaches, body aches. Drink plenty of fluids to prevent dehydration. You may continue to use other over-the-counter remedies for symptom control, if desired. Return for new or concerning symptoms such as worsening shortness of breath, coughing up blood, persistent vomiting, loss of consciousness.

## 2020-10-16 NOTE — ED Notes (Signed)
Patient reports taking at home COVID test with a "slight," line. Febrile all day today. Took alka seltzer + Patient now afebrile at 99.2.

## 2020-10-16 NOTE — ED Provider Notes (Signed)
[facility] EMERGENCY DEPARTMENT Provider Note   CSN: [id number] Arrival date & time: 10/15/20  2113     History Chief Complaint  Patient presents with  . Fever  . neck stiffness    Erin Lester is a 35 y.o. female.  35 year old female presents to the emergency department for evaluation of 2 days of URI symptoms.  Reports subjective, tactile fever which has been intermittent and associated with dizziness, myalgias, fatigue.  She also reports a headache primarily in her forehead and the medial aspect of her eyes.  Patient further endorses a mild, nonspecific cough.  She has not had any sore throat, chest pain, shortness of breath, nausea, vomiting, diarrhea, abdominal pain.  Has been using over-the-counter antipyretics for symptoms with minimal improvement.  She has been vaccinated against COVID-19, but denies receiving a booster.  Unsure of any recent sick contacts.  Did take an at home COVID test today that appeared vaguely positive.  The history is provided by the patient. No language interpreter was used.  Fever      Past Medical History:  Diagnosis Date  . Allergy   . Anemia   . Eczema   . Irregular menses   . Low iron   . Macromastia 05/2012    Patient Active Problem List   Diagnosis Date Noted  . Myalgia 01/20/2019  . S/P cesarean section 12/16/2015    Past Surgical History:  Procedure Laterality Date  . BREAST REDUCTION SURGERY  06/08/2012   Procedure: MAMMARY REDUCTION  (BREAST);  Surgeon: Louisa Second, MD;  Location: [facility];  Service: Plastics;  Laterality: Bilateral;  . BREAST SURGERY    . CESAREAN SECTION N/A 12/16/2015   Procedure: CESAREAN SECTION;  Surgeon: Marcelle Overlie, MD;  Location: [facility];  Service: Obstetrics;  Laterality: N/A;  STAT C Section      OB History    Gravida  1   Para  1   Term      Preterm  1   AB      Living  1     SAB      IAB      Ectopic      Multiple  0    Live Births  1           Family History  Problem Relation Age of Onset  . Asthma Mother   . Eczema Father   . Allergic rhinitis Father   . Eczema Sister   . Allergic rhinitis Sister   . Eczema Brother   . Allergic rhinitis Brother     Social History   Tobacco Use  . Smoking status: Never Smoker  . Smokeless tobacco: Never Used  Substance Use Topics  . Alcohol use: Not Currently    Comment: seldom  . Drug use: No    Home Medications Prior to Admission medications   Medication Sig Start Date End Date Taking? Authorizing Provider  albuterol (VENTOLIN HFA) 108 (90 Base) MCG/ACT inhaler Inhale 1-2 puffs into the lungs every 6 (six) hours as needed for wheezing or shortness of breath.  09/29/18   [provider]  azelastine (ASTELIN) 0.1 % nasal spray Place 1 spray into both nostrils 2 (two) times daily. Patient not taking: Reported on 11/29/2019 12/10/18   Marcelyn Bruins, MD  citalopram (CELEXA) 20 MG tablet Take 20 mg by mouth daily.    [provider]  cyanocobalamin 100 MCG tablet Take 100 mcg by mouth daily.  [provider]  ferrous sulfate 325 (65 FE) MG tablet Take 325 mg by mouth daily with breakfast.    [provider]  fexofenadine (ALLEGRA) 180 MG tablet Take 180 mg by mouth daily.    [provider]  fluticasone (FLONASE) 50 MCG/ACT nasal spray Place 2 sprays into both nostrils daily.    [provider]  folic acid (FOLVITE) 1 MG tablet Take 2 mg by mouth 2 (two) times daily.  01/13/18   [provider]  levocetirizine (XYZAL) 5 MG tablet Take 1 tablet (5 mg total) by mouth every evening. Patient not taking: Reported on 11/29/2019 12/10/18   Marcelyn Bruins, MD  Multiple Vitamins-Minerals (ALIVE WOMENS GUMMY) CHEW Chew 1 tablet by mouth daily.    [provider]  pyridOXINE (VITAMIN B-6) 100 MG tablet Take 100 mg by mouth daily.    [provider]  Saccharomyces  boulardii (PROBIOTIC) 250 MG CAPS Take 1 tablet by mouth daily.    [provider]  VITAMIN B1-B12 IM Inject into the muscle.    [provider]    Allergies    Tylenol [acetaminophen] and Soap  Review of Systems   Review of Systems  Constitutional: Positive for fever.  Ten systems reviewed and are negative for acute change, except as noted in the HPI.    Physical Exam Updated Vital Signs BP 103/68   Pulse 93   Temp 99.3 F (37.4 C) (Oral)   Resp 18   Ht 5\' 2"  (1.575 m)   Wt 87.9 kg   SpO2 97%   BMI 35.44 kg/m   Physical Exam Vitals and nursing note reviewed.  Constitutional:      General: She is not in acute distress.    Appearance: She is well-developed. She is not diaphoretic.     Comments: Nontoxic appearing and in NAD  HENT:     Head: Normocephalic and atraumatic.     Right Ear: External ear normal.     Left Ear: External ear normal.     Nose: Congestion present. No rhinorrhea.  Eyes:     General: No scleral icterus.    Conjunctiva/sclera: Conjunctivae normal.  Neck:     Comments: No nuchal rigidity or meningismus Cardiovascular:     Rate and Rhythm: Normal rate and regular rhythm.     Pulses: Normal pulses.  Pulmonary:     Effort: Pulmonary effort is normal. No respiratory distress.     Breath sounds: No stridor. No wheezing or rales.     Comments: Respirations even and unlabored. Lungs CTAB. Musculoskeletal:        General: Normal range of motion.     Cervical back: Normal range of motion.  Skin:    General: Skin is warm and dry.     Coloration: Skin is not pale.     Findings: No erythema or rash.  Neurological:     Mental Status: She is alert and oriented to person, place, and time.     Coordination: Coordination normal.  Psychiatric:        Behavior: Behavior normal.     ED Results / Procedures / Treatments   Labs (all labs ordered are listed, but only abnormal results are displayed) Labs Reviewed - No data to  display  EKG None  Radiology No results found.  Procedures Procedures   Medications Ordered in ED Medications  ibuprofen (ADVIL) tablet 800 mg (800 mg Oral Given 10/15/20 2156)    ED Course  I have  reviewed the triage vital signs and the nursing notes.  Pertinent labs & imaging results that were available during my care of the patient were reviewed by me and considered in my medical decision making (see chart for details).  Clinical Course as of 10/16/20 9892  Mon Oct 16, 2020  0229 Patient reports feeling better after receiving antipyretics.  She denies presence of headache. [KH]    Clinical Course User Index [KH] Antony Madura, PA-C   MDM Rules/Calculators/A&P                          Patient's symptoms are consistent with URI, likely viral etiology. Discussed that antibiotics are not indicated for viral infections. Patient will be discharged with symptomatic treatment.  She verbalizes understanding and is agreeable with plan. Patient is hemodynamically stable & in NAD prior to discharge.  Erin Lester was evaluated in Emergency Department on 10/16/2020 for the symptoms described in the history of present illness. She was evaluated in the context of the global COVID-19 pandemic, which necessitated consideration that the patient might be at risk for infection with the SARS-CoV-2 virus that causes COVID-19. Institutional protocols and algorithms that pertain to the evaluation of patients at risk for COVID-19 are in a state of rapid change based on information released by regulatory bodies including the CDC and federal and state organizations. These policies and algorithms were followed during the patient's care in the ED.   Final Clinical Impression(s) / ED Diagnoses Final diagnoses:  COVID-19 virus infection    Rx / DC Orders ED Discharge Orders    None       Antony Madura, PA-C 10/16/20 1194    Geoffery Lyons, MD 10/16/20 [phone]

## 2022-07-26 ENCOUNTER — Inpatient Hospital Stay (HOSPITAL_COMMUNITY)
Admission: AD | Admit: 2022-07-26 | Discharge: 2022-07-26 | Disposition: A | Discharge status: Home / Self Care | Payer: No Typology Code available for payment source | Attending: Obstetrics and Gynecology | Admitting: Obstetrics and Gynecology

## 2022-07-26 ENCOUNTER — Encounter (HOSPITAL_COMMUNITY): Payer: Self-pay | Admitting: Obstetrics and Gynecology

## 2022-07-26 ENCOUNTER — Inpatient Hospital Stay (HOSPITAL_COMMUNITY): Payer: No Typology Code available for payment source

## 2022-07-26 DIAGNOSIS — R1030 Lower abdominal pain, unspecified: Secondary | ICD-10-CM | POA: Insufficient documentation

## 2022-07-26 DIAGNOSIS — O26891 Other specified pregnancy related conditions, first trimester: Secondary | ICD-10-CM | POA: Diagnosis present

## 2022-07-26 DIAGNOSIS — R350 Frequency of micturition: Secondary | ICD-10-CM

## 2022-07-26 DIAGNOSIS — Z3A01 Less than 8 weeks gestation of pregnancy: Secondary | ICD-10-CM | POA: Diagnosis not present

## 2022-07-26 DIAGNOSIS — R102 Pelvic and perineal pain: Secondary | ICD-10-CM

## 2022-07-26 LAB — URINALYSIS, ROUTINE W REFLEX MICROSCOPIC
Bilirubin Urine: NEGATIVE
Glucose, UA: NEGATIVE mg/dL
Ketones, ur: 80 mg/dL — AB
Leukocytes,Ua: NEGATIVE
Nitrite: NEGATIVE
Protein, ur: NEGATIVE mg/dL
Specific Gravity, Urine: 1.026 (ref 1.005–1.030)
pH: 5 (ref 5.0–8.0)

## 2022-07-26 NOTE — Discharge Instructions (Signed)
Increase po fluid intake, remain hydrated

## 2022-07-26 NOTE — MAU Provider Note (Signed)
History     Chief Complaint  Patient presents with   Abdominal Pain   37 yo G2P0101 MBF @ 5 1/[redacted] wk gestation presents with c/o lower abdominal pain .  Pain noted on both side but more so on the right. Denies n/v/f. Pt points to vulvar area for site of pain  OB History     Gravida  2   Para  1   Term      Preterm  1   AB      Living  1      SAB      IAB      Ectopic      Multiple  0   Live Births  1           Past Medical History:  Diagnosis Date   Allergy    Anemia    Eczema    Irregular menses    Low iron    Macromastia 05/2012    Past Surgical History:  Procedure Laterality Date   BREAST REDUCTION SURGERY  06/08/2012   Procedure: MAMMARY REDUCTION  (BREAST);  Surgeon: Cristine Polio, MD;  Location: [place];  Service: Plastics;  Laterality: Bilateral;   BREAST SURGERY     CESAREAN SECTION N/A 12/16/2015   Procedure: CESAREAN SECTION;  Surgeon: Dian Queen, MD;  Location: [place];  Service: Obstetrics;  Laterality: N/A;  STAT C Section     Family History  Problem Relation Age of Onset   Asthma Mother    Eczema Father    Allergic rhinitis Father    Eczema Sister    Allergic rhinitis Sister    Eczema Brother    Allergic rhinitis Brother     Social History   Tobacco Use   Smoking status: Never   Smokeless tobacco: Never  Substance Use Topics   Alcohol use: Not Currently    Comment: seldom   Drug use: No    Allergies:  Allergies  Allergen Reactions   Tylenol [Acetaminophen] Nausea And Vomiting   Soap Itching    Floral/Fruity Soap    Medications Prior to Admission  Medication Sig Dispense Refill Last Dose   Prenatal Vit-Fe Fumarate-FA (PRENATAL MULTIVITAMIN) TABS tablet Take 1 tablet by mouth daily at 12 noon.   07/25/2022   albuterol (VENTOLIN HFA) 108 (90 Base) MCG/ACT inhaler Inhale 1-2 puffs into the lungs every 6 (six) hours as needed for wheezing or shortness of breath.       azelastine  (ASTELIN) 0.1 % nasal spray Place 1 spray into both nostrils 2 (two) times daily. (Patient not taking: Reported on 11/29/2019) 30 mL 5    citalopram (CELEXA) 20 MG tablet Take 20 mg by mouth daily.      cyanocobalamin 100 MCG tablet Take 100 mcg by mouth daily.      ferrous sulfate 325 (65 FE) MG tablet Take 325 mg by mouth daily with breakfast.      fexofenadine (ALLEGRA) 180 MG tablet Take 180 mg by mouth daily.      fluticasone (FLONASE) 50 MCG/ACT nasal spray Place 2 sprays into both nostrils daily.      folic acid (FOLVITE) 1 MG tablet Take 2 mg by mouth 2 (two) times daily.   4    levocetirizine (XYZAL) 5 MG tablet Take 1 tablet (5 mg total) by mouth every evening. (Patient not taking: Reported on 11/29/2019) 30 tablet 5    Multiple Vitamins-Minerals (ALIVE WOMENS GUMMY) CHEW Chew 1 tablet by  mouth daily.      pyridOXINE (VITAMIN B-6) 100 MG tablet Take 100 mg by mouth daily.      Saccharomyces boulardii (PROBIOTIC) 250 MG CAPS Take 1 tablet by mouth daily.      VITAMIN B1-B12 IM Inject into the muscle.        Physical Exam   Blood pressure 134/72, pulse 100, temperature 99.6 F (37.6 C), temperature source Oral, resp. rate 13, height 5\' 2"  (1.575 m), weight 105.2 kg, SpO2 100 %, unknown if currently breastfeeding.  General appearance: alert, cooperative, and no distress Lungs: clear to auscultation bilaterally Heart: regular rate and rhythm, S1, S2 normal, no murmur, click, rub or gallop Abdomen: soft, non-tender; bowel sounds normal; no masses,  no organomegaly Pelvic: cervix normal in appearance, external genitalia normal, no adnexal masses or tenderness, rectovaginal septum normal, uterus normal size, shape, and consistency, and vagina normal without discharge Extremities: extremities normal, atraumatic, no cyanosis or edema   Results for orders placed or performed during the hospital encounter of 07/26/22 (from the past 24 hour(s))  Urinalysis, Routine w reflex microscopic -Urine,  Clean Catch     Status: Abnormal   Collection Time: 07/26/22  4:43 PM  Result Value Ref Range   Color, Urine YELLOW YELLOW   APPearance HAZY (A) CLEAR   Specific Gravity, Urine 1.026 1.005 - 1.030   pH 5.0 5.0 - 8.0   Glucose, UA NEGATIVE NEGATIVE mg/dL   Hgb urine dipstick SMALL (A) NEGATIVE   Bilirubin Urine NEGATIVE NEGATIVE   Ketones, ur 80 (A) NEGATIVE mg/dL   Protein, ur NEGATIVE NEGATIVE mg/dL   Nitrite NEGATIVE NEGATIVE   Leukocytes,Ua NEGATIVE NEGATIVE   RBC / HPF 0-5 0 - 5 RBC/hpf   WBC, UA 0-5 0 - 5 WBC/hpf   Bacteria, UA RARE (A) NONE SEEN   Squamous Epithelial / HPF 6-10 0 - 5 /HPF   Mucus PRESENT     ED Course  IMP: abdominal pain affecting pregnancy  P) pelvic sonogram. U/a done MDM   Marvene Staff, MD 5:48 PM 07/26/2022    Addendum US OB LESS THAN 14 WEEKS WITH OB TRANSVAGINAL  Result Date: 07/26/2022 CLINICAL DATA:  Abdominal pain during pregnancy. Estimated gestational age by LMP is 5 weeks 5 days. Quantitative beta HCG was not obtained. EXAM: OBSTETRIC <14 WK [place] AND TRANSVAGINAL OB US TECHNIQUE: Transvaginal ultrasound was performed for complete evaluation of the gestation as well as the maternal uterus, adnexal regions, and pelvic cul-de-sac. COMPARISON:  None Available. FINDINGS: Intrauterine gestational sac: A single intrauterine gestational sac was identified. Yolk sac:  Not Visualized. Embryo:  Visualized. Cardiac Activity: Visualized. Heart Rate: 81 bpm CRL:   2 mm   5 w 5 d                  [place] EDC: 03/24/2023 Subchorionic hemorrhage:  None visualized. Maternal uterus/adnexae: Uterus is anteverted. No myometrial mass lesions. Small nabothian cysts in the cervix. Both ovaries are visualized and appear normal. Corpus luteum is demonstrated on the left ovary. Physiologic cysts are present. A moderate amount of free fluid is demonstrated in the pelvis. IMPRESSION: A single intrauterine gestational sac with fetal pole are identified. The yolk sac is not  visualized. Estimated gestational age by crown-rump length is 5 weeks 5 days. Moderate free fluid in the pelvis. Electronically Signed   By: Lucienne Capers M.D.   On: 07/26/2022 17:23    Reviewed sonogram results with pt. Repeat sonogram to f/u viability given  low fetal heart rate which maybe erroneous. D/c home F/u 2 wk

## 2022-07-26 NOTE — MAU Note (Addendum)
Erin Lester is a 37 y.o. at Unknown here in MAU reporting: lower abdominal pain that started yesterday evening. Pt states the pain is right and left lower abdominal that radiates to the back. Pt states the right side hurts worse. The pain is sharp and constant and the pt rates it 6/10. Pt has a hx of complete placental abruption. Pt denies VB or LOF.   LMP: 06/16/22 Onset of complaint: 07/25/22 Pain score: 6/10 Vitals:   07/26/22 1604  BP: 134/72  Pulse: 100  Resp: 13  Temp: 99.6 F (37.6 C)  SpO2: 100%     FHT: N/A Lab orders placed from triage:

## 2022-07-31 ENCOUNTER — Other Ambulatory Visit: Payer: Self-pay | Admitting: Obstetrics and Gynecology

## 2022-07-31 DIAGNOSIS — O3680X0 Pregnancy with inconclusive fetal viability, not applicable or unspecified: Secondary | ICD-10-CM

## 2022-08-14 ENCOUNTER — Ambulatory Visit
Admission: RE | Admit: 2022-08-14 | Discharge: 2022-08-14 | Disposition: A | Discharge status: Home / Self Care | Payer: No Typology Code available for payment source | Source: Ambulatory Visit | Attending: Obstetrics and Gynecology | Admitting: Obstetrics and Gynecology

## 2022-08-14 DIAGNOSIS — O3680X0 Pregnancy with inconclusive fetal viability, not applicable or unspecified: Secondary | ICD-10-CM | POA: Insufficient documentation

## 2022-09-03 ENCOUNTER — Inpatient Hospital Stay (HOSPITAL_COMMUNITY): Payer: No Typology Code available for payment source

## 2022-09-03 ENCOUNTER — Other Ambulatory Visit: Payer: Self-pay

## 2022-09-03 ENCOUNTER — Inpatient Hospital Stay (HOSPITAL_COMMUNITY)
Admission: AD | Admit: 2022-09-03 | Discharge: 2022-09-03 | Disposition: A | Discharge status: Home / Self Care | Payer: No Typology Code available for payment source | Attending: Obstetrics and Gynecology | Admitting: Obstetrics and Gynecology

## 2022-09-03 ENCOUNTER — Encounter (HOSPITAL_COMMUNITY): Payer: Self-pay | Admitting: Obstetrics and Gynecology

## 2022-09-03 DIAGNOSIS — R109 Unspecified abdominal pain: Secondary | ICD-10-CM | POA: Insufficient documentation

## 2022-09-03 DIAGNOSIS — Z3A11 11 weeks gestation of pregnancy: Secondary | ICD-10-CM | POA: Diagnosis not present

## 2022-09-03 DIAGNOSIS — O26851 Spotting complicating pregnancy, first trimester: Secondary | ICD-10-CM | POA: Insufficient documentation

## 2022-09-03 DIAGNOSIS — O26891 Other specified pregnancy related conditions, first trimester: Secondary | ICD-10-CM | POA: Insufficient documentation

## 2022-09-03 DIAGNOSIS — O200 Threatened abortion: Secondary | ICD-10-CM

## 2022-09-03 HISTORY — DX: Unspecified asthma, uncomplicated: J45.909

## 2022-09-03 NOTE — MAU Note (Signed)
Dr Garwin Brothers reviewed the u/s results with the pt over the phone and pt ok for d/c home. Pt has appt with Dr Garwin Brothers tomorrow and will keep that appt.

## 2022-09-03 NOTE — MAU Provider Note (Signed)
History     Chief Complaint  Patient presents with   Vaginal Bleeding  37 yo G2P0101 MBF @ 11 2/[redacted] wk gestation presented  To ER w/ c/o vaginal bleeding and cramping particular on right side and then  lower abdomen. Pt noted blood with wiping. No recent intercourse. BM today. Denies urinary sx.   OB History     Gravida  2   Para  1   Term      Preterm  1   AB      Living  1      SAB      IAB      Ectopic      Multiple  0   Live Births  1           Past Medical History:  Diagnosis Date   Allergy    Anemia    Asthma    Eczema    Irregular menses    Low iron    Macromastia 05/2012    Past Surgical History:  Procedure Laterality Date   BREAST REDUCTION SURGERY  06/08/2012   Procedure: MAMMARY REDUCTION  (BREAST);  Surgeon: Cristine Polio, MD;  Location: [place];  Service: Plastics;  Laterality: Bilateral;   BREAST SURGERY     CESAREAN SECTION N/A 12/16/2015   Procedure: CESAREAN SECTION;  Surgeon: Dian Queen, MD;  Location: [place];  Service: Obstetrics;  Laterality: N/A;  STAT C Section     Family History  Problem Relation Age of Onset   Asthma Mother    Eczema Father    Allergic rhinitis Father    Eczema Sister    Allergic rhinitis Sister    Eczema Brother    Allergic rhinitis Brother     Social History   Tobacco Use   Smoking status: Never   Smokeless tobacco: Never  Vaping Use   Vaping Use: Never used  Substance Use Topics   Alcohol use: Not Currently    Comment: seldom   Drug use: No    Allergies:  Allergies  Allergen Reactions   Soap Itching    Floral/Fruity Soap    Medications Prior to Admission  Medication Sig Dispense Refill Last Dose   Prenatal Vit-Fe Fumarate-FA (PRENATAL MULTIVITAMIN) TABS tablet Take 1 tablet by mouth daily at 12 noon.   09/02/2022     Physical Exam   Blood pressure 111/65, pulse 84, temperature 98.8 F (37.1 C), temperature source Oral, resp. rate 18, height  '5\' 2"'$  (1.575 m), weight 103.8 kg, SpO2 100 %, unknown if currently breastfeeding.  General appearance: alert, cooperative, and no distress Lungs: clear to auscultation bilaterally Heart: regular rate and rhythm, S1, S2 normal, no murmur, click, rub or gallop Abdomen:  soft nontender  obese Pelvic: cervix normal in appearance, external genitalia normal, no adnexal masses or tenderness, and small clot removed from vagina. No active bleeding. Cervix closed/firm. Uterus gravid 12 wk size nontender Extremities: extremities normal, atraumatic, no cyanosis or edema  ImP: vaginal bleeding in pregnancy P) OB sonogram Addendum:  US OB Comp Less 14 Wks  Result Date: 09/03/2022 CLINICAL DATA:  Vaginal bleeding and abdominal cramping. Beta HCG unavailable. GA by first ultrasound 11 weeks 2 days EXAM: OBSTETRIC <14 WK ULTRASOUND TECHNIQUE: Transabdominal ultrasound was performed for evaluation of the gestation as well as the maternal uterus and adnexal regions. COMPARISON:  Ultrasound 08/14/2022 FINDINGS: Intrauterine gestational sac: Single Yolk sac:  Not Visualized. Embryo:  Visualized. Cardiac Activity: Visualized. Heart Rate: 167  bpm CRL:   41.0 mm   11 w 0 d                  [place] EDC: 03/25/2023 Subchorionic hemorrhage: Small amount of subchorionic hemorrhage encompassing less than 25% of the gestational sac end of doubtful clinical significance. Maternal uterus/adnexae: Adnexa are unremarkable.  No free fluid. IMPRESSION: Single intrauterine pregnancy as above. Small amount of subchorionic hemorrhage encompassing less than 25% of the gestational sac end of doubtful clinical significance. Follow-up as clinically indicated. Electronically Signed   By: Placido Sou M.D.   On: 09/03/2022 19:26    ED Course  Vaginal bleeding in pregnancy [facility] Sono reviewed and disc with pt:  dx: threatened abortion precautions given. Keep OB appt tomorrow MDM   Marvene Staff, MD 6:34 PM 09/03/2022

## 2022-09-03 NOTE — Progress Notes (Signed)
WRitten and verbal d/c instructions given and understanding voiced.  

## 2022-09-03 NOTE — MAU Note (Signed)
Erin Lester is a 37 y.o. at 5w2dhere in MAU reporting: she began having bright red VB that began approximately 20 minutes ago.  States VB noticed with wiping.  Also reports lower back and abdominal pain, reports that's been intermittent since yesterday.  Denies recent intercourse. LMP: NA Onset of complaint: yesterday Pain score: 7 abdomen & lower back Vitals:   09/03/22 1749  BP: 111/65  Pulse: 84  Resp: 18  Temp: 98.8 F (37.1 C)  SpO2: 100%     FHT: Attempted, none heard Lab orders placed from triage:   None

## 2022-09-04 ENCOUNTER — Encounter (HOSPITAL_COMMUNITY): Payer: Self-pay | Admitting: Obstetrics and Gynecology

## 2022-09-04 ENCOUNTER — Inpatient Hospital Stay (HOSPITAL_COMMUNITY)
Admission: AD | Admit: 2022-09-04 | Discharge: 2022-09-04 | Disposition: A | Discharge status: Home / Self Care | Payer: No Typology Code available for payment source | Attending: Obstetrics and Gynecology | Admitting: Obstetrics and Gynecology

## 2022-09-04 ENCOUNTER — Inpatient Hospital Stay (HOSPITAL_COMMUNITY): Payer: No Typology Code available for payment source

## 2022-09-04 DIAGNOSIS — M549 Dorsalgia, unspecified: Secondary | ICD-10-CM | POA: Diagnosis not present

## 2022-09-04 DIAGNOSIS — O021 Missed abortion: Secondary | ICD-10-CM | POA: Diagnosis not present

## 2022-09-04 DIAGNOSIS — R109 Unspecified abdominal pain: Secondary | ICD-10-CM | POA: Insufficient documentation

## 2022-09-04 DIAGNOSIS — O26891 Other specified pregnancy related conditions, first trimester: Secondary | ICD-10-CM | POA: Diagnosis present

## 2022-09-04 DIAGNOSIS — Z3A11 11 weeks gestation of pregnancy: Secondary | ICD-10-CM | POA: Insufficient documentation

## 2022-09-04 HISTORY — DX: Missed abortion: O02.1

## 2022-09-04 LAB — CBC
HCT: 29.8 % — ABNORMAL LOW (ref 36.0–46.0)
Hemoglobin: 9.3 g/dL — ABNORMAL LOW (ref 12.0–15.0)
MCH: 22.5 pg — ABNORMAL LOW (ref 26.0–34.0)
MCHC: 31.2 g/dL (ref 30.0–36.0)
MCV: 72.2 fL — ABNORMAL LOW (ref 80.0–100.0)
Platelets: 329 10*3/uL (ref 150–400)
RBC: 4.13 MIL/uL (ref 3.87–5.11)
RDW: 15.6 % — ABNORMAL HIGH (ref 11.5–15.5)
WBC: 8.3 10*3/uL (ref 4.0–10.5)
nRBC: 0 % (ref 0.0–0.2)

## 2022-09-04 LAB — TYPE AND SCREEN
ABO/RH(D): B POS
Antibody Screen: NEGATIVE

## 2022-09-04 MED ORDER — OXYCODONE-ACETAMINOPHEN 5-325 MG PO TABS: 1.0000 | ORAL_TABLET | Freq: Four times a day (QID) | ORAL | 0 refills | Status: AC | PRN

## 2022-09-04 MED ORDER — KETOROLAC TROMETHAMINE 60 MG/2ML IM SOLN
60.0000 mg | Freq: Once | INTRAMUSCULAR | Status: AC
  Administered 2022-09-04: 60 mg via INTRAMUSCULAR
  Filled 2022-09-04: qty 2

## 2022-09-04 NOTE — MAU Provider Note (Signed)
History     Chief Complaint  Patient presents with   Vaginal Bleeding   Abdominal Pain   Back Pain  37 yo G2P0101 MBF @ 11 3/7 wk presents to ER complaining of severe abdominal pain ( started at 10/10) now decreased But still present. Pain started this am.  Also notes increased vaginal bleeding. Pt was just seen in ER last night and had sonogram with finding With [facility]  (+) SIUP   OB History     Gravida  2   Para  1   Term      Preterm  1   AB      Living  1      SAB      IAB      Ectopic      Multiple  0   Live Births  1           Past Medical History:  Diagnosis Date   Allergy    Anemia    Asthma    Eczema    Irregular menses    Low iron    Macromastia 05/2012    Past Surgical History:  Procedure Laterality Date   BREAST REDUCTION SURGERY  06/08/2012   Procedure: MAMMARY REDUCTION  (BREAST);  Surgeon: Cristine Polio, MD;  Location: [place];  Service: Plastics;  Laterality: Bilateral;   BREAST SURGERY     CESAREAN SECTION N/A 12/16/2015   Procedure: CESAREAN SECTION;  Surgeon: Dian Queen, MD;  Location: [place];  Service: Obstetrics;  Laterality: N/A;  STAT C Section     Family History  Problem Relation Age of Onset   Asthma Mother    Eczema Father    Allergic rhinitis Father    Eczema Sister    Allergic rhinitis Sister    Eczema Brother    Allergic rhinitis Brother     Social History   Tobacco Use   Smoking status: Never   Smokeless tobacco: Never  Vaping Use   Vaping Use: Never used  Substance Use Topics   Alcohol use: Not Currently    Comment: seldom   Drug use: No    Allergies:  Allergies  Allergen Reactions   Latex Rash   Soap Itching    Floral/Fruity Soap    Medications Prior to Admission  Medication Sig Dispense Refill Last Dose   Prenatal Vit-Fe Fumarate-FA (PRENATAL MULTIVITAMIN) TABS tablet Take 1 tablet by mouth daily at 12 noon.        Physical Exam   Blood pressure  121/69, pulse 82, temperature 99.6 F (37.6 C), temperature source Oral, resp. rate 18, height '5\' 2"'$  (1.575 m), weight 102.5 kg, SpO2 99 %, unknown if currently breastfeeding.  General appearance: alert, cooperative, and no distress Lungs: clear to auscultation bilaterally Heart: regular rate and rhythm, S1, S2 normal, no murmur, click, rub or gallop Abdomen:  soft obese nondistended Pelvic: cervix normal in appearance, external genitalia normal, and SSE placed. Vagina filled with blood and large clot Cervix closed/long/ uterus AV 12 wk size Extremities: extremities normal, atraumatic, no cyanosis or edema  IMP: vaginal bleeding in pregnancy f/o SAB P) OB sonogram. CBC , T&S ED Course   MDM Addendum US OB Transvaginal  Result Date: 09/04/2022 CLINICAL DATA:  Vaginal bleeding affecting pregnancy. EXAM: TRANSVAGINAL OB ULTRASOUND TECHNIQUE: Transvaginal ultrasound was performed for complete evaluation of the gestation as well as the maternal uterus, adnexal regions, and pelvic cul-de-sac. COMPARISON:  09/03/2022 FINDINGS: Intrauterine gestational sac: Single Yolk  sac:  Visualized. Embryo:  Visualized. Cardiac Activity: Not Visualized. Heart Rate: NA bpm CRL:   42.3 mm   11 w 1 d                  [place] EDC: 03/25/2023 Maternal uterus/adnexae: Subchorionic hemorrhage: Small Right ovary: Normal Left ovary: Normal containing a cyst measuring 2.7 cm. Other :None Free fluid:  None IMPRESSION: 1. There is a single intrauterine gestational sac containing an embryo and yolk sac. Previous cardiac activity is no longer visualized. Findings meet definitive criteria for failed pregnancy. This follows SRU consensus guidelines: Diagnostic Criteria for Nonviable Pregnancy Early in the First Trimester. Alison Stalling J Med [phone]. 2. Small subchorionic hemorrhage. Electronically Signed   By: Kerby Moors M.D.   On: 09/04/2022 12:05   US OB Comp Less 14 Wks  Result Date: 09/03/2022 CLINICAL DATA:  Vaginal  bleeding and abdominal cramping. Beta HCG unavailable. GA by first ultrasound 11 weeks 2 days EXAM: OBSTETRIC <14 WK ULTRASOUND TECHNIQUE: Transabdominal ultrasound was performed for evaluation of the gestation as well as the maternal uterus and adnexal regions. COMPARISON:  Ultrasound 08/14/2022 FINDINGS: Intrauterine gestational sac: Single Yolk sac:  Not Visualized. Embryo:  Visualized. Cardiac Activity: Visualized. Heart Rate: 167 bpm CRL:   41.0 mm   11 w 0 d                  [place] EDC: 03/25/2023 Subchorionic hemorrhage: Small amount of subchorionic hemorrhage encompassing less than 25% of the gestational sac end of doubtful clinical significance. Maternal uterus/adnexae: Adnexa are unremarkable.  No free fluid. IMPRESSION: Single intrauterine pregnancy as above. Small amount of subchorionic hemorrhage encompassing less than 25% of the gestational sac end of doubtful clinical significance. Follow-up as clinically indicated. Electronically Signed   By: Placido Sou M.D.   On: 09/03/2022 19:26    Results for orders placed or performed during the hospital encounter of 09/04/22 (from the past 24 hour(s))  Type and screen [facility]     Status: None   Collection Time: 09/04/22 11:26 AM  Result Value Ref Range   ABO/RH(D) B POS    Antibody Screen NEG    Sample Expiration      09/07/2022,2359 Performed at [facility] [address] [place], [place] [address]   CBC     Status: Abnormal   Collection Time: 09/04/22 11:29 AM  Result Value Ref Range   WBC 8.3 4.0 - 10.5 K/uL   RBC 4.13 3.87 - 5.11 MIL/uL   Hemoglobin 9.3 (L) 12.0 - 15.0 g/dL   HCT 29.8 (L) 36.0 - 46.0 %   MCV 72.2 (L) 80.0 - 100.0 fL   MCH 22.5 (L) 26.0 - 34.0 pg   MCHC 31.2 30.0 - 36.0 g/dL   RDW 15.6 (H) 11.5 - 15.5 %   Platelets 329 150 - 400 K/uL   nRBC 0.0 0.0 - 0.2 %     IMP: Missed Abortion Reviewed sonogram report with pt and husband.  [facility] much less than last night. Unfortunately Baby passed.   Options for mgmt disc: recommend suction D& E( sono guided). Reviewed risk of procedure  Including infection, bleeding, injury to surrounding organ structures, retained tissue, uterine scar separation, injuryto Surrounding organ structures.  Pt agrees However asked pt to check on insurance card if prior authorization needed. Per card, need pre-certified Given the latter, need to defer surgery today. . Disc not acute case P toradol 60 mg IM x  1. Precertification for outpatient u/s guided suction Dilation and evacuation sent to scheduler Script for percocet given  Marvene Staff, MD 11:15 AM 09/04/2022

## 2022-09-04 NOTE — Discharge Instructions (Signed)
CALL  IF TEMP>100.4, NOTHING PER VAGINA X 2 WK, CALL IF SOAKING A MAXI  PAD EVERY HOUR OR MORE FREQUENTLY °

## 2022-09-04 NOTE — MAU Note (Signed)
Erin Lester is a 37 y.o. at 56w3dhere in MAU reporting: is having a lot of pain, started as a really sharp intense RLQ, now is kind of all over abd and lower back.  Had a little pain yesterday on right.  Still having some bleeding. Onset of complaint: when she woke up "hit her like a ton of bricks" Pain score: 8 Vitals:   09/04/22 1027  BP: 121/69  Pulse: 82  Resp: 18  Temp: 99.6 F (37.6 C)  SpO2: 99%     FYF:[id number] get (has not heard with doppler yet, only on UA) Lab orders placed from triage:   Had work up yesterday, did call dr CGarwin Brothers

## 2022-09-05 ENCOUNTER — Encounter (HOSPITAL_BASED_OUTPATIENT_CLINIC_OR_DEPARTMENT_OTHER): Payer: Self-pay | Admitting: Obstetrics and Gynecology

## 2022-09-05 ENCOUNTER — Other Ambulatory Visit (HOSPITAL_COMMUNITY): Payer: Self-pay | Admitting: Obstetrics and Gynecology

## 2022-09-05 ENCOUNTER — Other Ambulatory Visit: Payer: Self-pay | Admitting: Obstetrics and Gynecology

## 2022-09-05 DIAGNOSIS — O021 Missed abortion: Secondary | ICD-10-CM

## 2022-09-05 NOTE — Progress Notes (Signed)
Spoke w/ via phone for pre-op interview--- pt Lab needs dos----  no (per anes)/  orders pending             Lab results------ current lab results dated 09-04-2022 CBC/ T&S in epic COVID test -----patient states asymptomatic no test needed Arrive at ------- 1115 on 09-06-2022 NPO after MN NO Solid Food.  Clear liquids from MN until--- 1015 Med rec completed Medications to take morning of surgery ----- none Diabetic medication ----- n/a Patient instructed no nail polish to be worn day of surgery Patient instructed to bring photo id and insurance card day of surgery Patient aware to have Driver (ride ) / caregiver    for 24 hours after surgery -- husband, dominique Patient Special Instructions ----- n/a Pre-Op special Istructions ----- case just added on today, pre-op orders pending Patient verbalized understanding of instructions that were given at this phone interview. Patient denies shortness of breath, chest pain, fever, cough at this phone interview.

## 2022-09-06 ENCOUNTER — Other Ambulatory Visit (HOSPITAL_COMMUNITY): Admission: RE | Admit: 2022-09-06 | Payer: No Typology Code available for payment source | Source: Ambulatory Visit

## 2022-09-06 ENCOUNTER — Inpatient Hospital Stay (HOSPITAL_COMMUNITY)
Admission: AD | Admit: 2022-09-06 | Discharge: 2022-09-06 | Disposition: A | Discharge status: Home / Self Care | Payer: No Typology Code available for payment source | Attending: Obstetrics and Gynecology | Admitting: Obstetrics and Gynecology

## 2022-09-06 ENCOUNTER — Encounter (HOSPITAL_COMMUNITY)
Admission: AD | Disposition: A | Discharge status: Home / Self Care | Payer: Self-pay | Source: Home / Self Care | Attending: Obstetrics and Gynecology

## 2022-09-06 ENCOUNTER — Encounter (HOSPITAL_COMMUNITY): Payer: Self-pay | Admitting: Obstetrics and Gynecology

## 2022-09-06 ENCOUNTER — Inpatient Hospital Stay (HOSPITAL_COMMUNITY): Payer: No Typology Code available for payment source

## 2022-09-06 ENCOUNTER — Ambulatory Visit (HOSPITAL_BASED_OUTPATIENT_CLINIC_OR_DEPARTMENT_OTHER)
Admission: RE | Admit: 2022-09-06 | Payer: No Typology Code available for payment source | Source: Home / Self Care | Admitting: Obstetrics and Gynecology

## 2022-09-06 ENCOUNTER — Ambulatory Visit (HOSPITAL_COMMUNITY): Admission: RE | Admit: 2022-09-06 | Payer: No Typology Code available for payment source | Source: Ambulatory Visit

## 2022-09-06 DIAGNOSIS — O039 Complete or unspecified spontaneous abortion without complication: Secondary | ICD-10-CM | POA: Insufficient documentation

## 2022-09-06 DIAGNOSIS — O021 Missed abortion: Secondary | ICD-10-CM

## 2022-09-06 DIAGNOSIS — Z01818 Encounter for other preprocedural examination: Secondary | ICD-10-CM

## 2022-09-06 HISTORY — DX: Sickle-cell trait: D57.3

## 2022-09-06 HISTORY — DX: Other asthma: J45.998

## 2022-09-06 HISTORY — DX: Presence of spectacles and contact lenses: Z97.3

## 2022-09-06 HISTORY — DX: Palpitations: R00.2

## 2022-09-06 HISTORY — DX: Constipation, unspecified: K59.00

## 2022-09-06 SURGERY — DILATION AND EVACUATION, UTERUS
Anesthesia: General

## 2022-09-06 MED ORDER — KETOROLAC TROMETHAMINE 30 MG/ML IJ SOLN
30.0000 mg | Freq: Once | INTRAMUSCULAR | Status: AC
  Administered 2022-09-06: 30 mg via INTRAVENOUS

## 2022-09-06 MED ORDER — IBUPROFEN 800 MG PO TABS: 800.0000 mg | ORAL_TABLET | Freq: Three times a day (TID) | ORAL | 0 refills | Status: AC | PRN

## 2022-09-06 MED ORDER — KETOROLAC TROMETHAMINE 30 MG/ML IJ SOLN
INTRAMUSCULAR | Status: AC
  Filled 2022-09-06: qty 1

## 2022-09-06 NOTE — MAU Provider Note (Signed)
Chief Complaint: Miscarriage   Event Date/Time   First Provider Initiated Contact with Patient 09/06/22 0700       Patient of Dr Garwin Brothers, treated initially by me due to emergent condition  SUBJECTIVE HPI: Erin Lester is a 37 y.o. G2P0101 at [redacted]w[redacted]d by LMP who presents via EMS to maternity admissions reporting severe pelvic cramping. Was given 210mcg of Fentanyl by EMS en route.  Patient presents screaming, sitting straight up   States she was told her baby had passed a few days ago.  Was scheduled for a D&C.  Chart review confirms IUFD diagnosed on 09/04/22  HPI  Past Medical History:  Diagnosis Date   Anemia    Constipation    Eczema    History of placenta abruption 11/2015   preterm 27 weeks c/s delivery per ultrasound placenta abruption   Intermittent palpitations    09-05-2022  per pt had work-up approx 4 yrs ago included echo and event monitor told everything normal   Irregular menses    Low iron    Missed ab 09/04/2022   Seasonal asthma    09-05-2022  per pt last used rescue inhaler 2 yrs ago   Sickle cell trait ([place])    Wears glasses    Past Surgical History:  Procedure Laterality Date   BREAST REDUCTION SURGERY  06/08/2012   Procedure: MAMMARY REDUCTION  (BREAST);  Surgeon: Cristine Polio, MD;  Location: [place];  Service: Plastics;  Laterality: Bilateral;   CESAREAN SECTION N/A 12/16/2015   Procedure: CESAREAN SECTION;  Surgeon: Dian Queen, MD;  Location: [place];  Service: Obstetrics;  Laterality: N/A;  STAT C Section    Social History   Socioeconomic History   Marital status: Married    Spouse name: Not on file   Number of children: Not on file   Years of education: Not on file   Highest education level: Not on file  Occupational History   Not on file  Tobacco Use   Smoking status: Never   Smokeless tobacco: Never  Vaping Use   Vaping Use: Never used  Substance and Sexual Activity   Alcohol use: Not Currently     Comment: seldom   Drug use: Never   Sexual activity: Not Currently    Birth control/protection: Coitus interruptus  Other Topics Concern   Not on file  Social History Narrative   Not on file   Social Determinants of Health   Financial Resource Strain: Not on file  Food Insecurity: Not on file  Transportation Needs: Not on file  Physical Activity: Not on file  Stress: Not on file  Social Connections: Not on file  Intimate Partner Violence: Not on file   No current facility-administered medications on file prior to encounter.   Current Outpatient Medications on File Prior to Encounter  Medication Sig Dispense Refill   oxyCODONE-acetaminophen (PERCOCET/ROXICET) 5-325 MG tablet Take 1 tablet by mouth every 6 (six) hours as needed for up to 5 days for severe pain. 6 tablet 0   albuterol (VENTOLIN HFA) 108 (90 Base) MCG/ACT inhaler Inhale 1-2 puffs into the lungs every 6 (six) hours as needed for wheezing or shortness of breath.     calcium carbonate (TUMS - DOSED IN MG ELEMENTAL CALCIUM) 500 MG chewable tablet Chew 1 tablet by mouth as needed for indigestion or heartburn.     Prenatal Vit-Fe Fumarate-FA (PRENATAL MULTIVITAMIN) TABS tablet Take 1 tablet by mouth daily at 12 noon.     Allergies  Allergen Reactions   Other Itching    Per pt apples/ banana/ oranges cause her gums to itch and throb   Latex Rash   Soap Itching    Floral/Fruity Soap    I have reviewed patient's Past Medical Hx, Surgical Hx, Family Hx, Social Hx, medications and allergies.   ROS:  Review of Systems  Reason unable to perform ROS: Level 5 Caveat due to severity of condition.  Genitourinary:  Positive for pelvic pain.   Review of Systems  Other systems negative   Physical Exam  Physical Exam Patient Vitals for the past 24 hrs:  BP Temp Temp src Pulse Resp SpO2  09/06/22 0620 (!) 145/78 98.7 F (37.1 C) Oral (!) 105 20 99 %   Constitutional: Well-developed, well-nourished female in no acute  distress.  Cardiovascular: normal rate Respiratory: normal effort GI: Abd soft, non-tender.  MS: Extremities nontender, no edema, normal ROM Neurologic: Alert and oriented x 4.  GU: Neg CVAT.  PELVIC EXAM: Speculum inserted.  Large amount of clotted blood visible at os, about 4-5cm in diameter. Clot removed with Ring Forcep, patient still screaming.  Unable to get fetus through the cervix with Ring forceps SHortly thereafter, fetus was expelled spontaneously Additional clot removed.   Pain much improved per patient report.  Edges of cervix are quite irregular and friable.  No active bleeding noted.  Toradol given for additional pain relief.   LAB RESULTS No results found for this or any previous visit (from the past 24 hour(s)).  --/--/B POS (03/13 1126)  IMAGING (from 2 days ago) US OB Transvaginal  Result Date: 09/04/2022 CLINICAL DATA:  Vaginal bleeding affecting pregnancy. EXAM: TRANSVAGINAL OB ULTRASOUND TECHNIQUE: Transvaginal ultrasound was performed for complete evaluation of the gestation as well as the maternal uterus, adnexal regions, and pelvic cul-de-sac. COMPARISON:  09/03/2022 FINDINGS: Intrauterine gestational sac: Single Yolk sac:  Visualized. Embryo:  Visualized. Cardiac Activity: Not Visualized. Heart Rate: NA bpm CRL:   42.3 mm   11 w 1 d                  [place] EDC: 03/25/2023 Maternal uterus/adnexae: Subchorionic hemorrhage: Small Right ovary: Normal Left ovary: Normal containing a cyst measuring 2.7 cm. Other :None Free fluid:  None IMPRESSION: 1. There is a single intrauterine gestational sac containing an embryo and yolk sac. Previous cardiac activity is no longer visualized. Findings meet definitive criteria for failed pregnancy. This follows SRU consensus guidelines: Diagnostic Criteria for Nonviable Pregnancy Early in the First Trimester. Alison Stalling J Med [phone]. 2. Small subchorionic hemorrhage. Electronically Signed   By: Kerby Moors M.D.   On: 09/04/2022 12:05      MAU Management/MDM: I have reviewed the triage vital signs and the nursing notes.   Pertinent labs & imaging results that were available during my care of the patient were reviewed by me and considered in my medical decision making (see chart for details).      I have reviewed her medical records including past results, notes and treatments. Medical, Surgical, and family history were reviewed.  Medications and recent lab tests were reviewed  See above for procedure note  Toradol given for pain since pt had received max dose of Fentanyl [place] ordered to assess for completion of SAB  ASSESSMENT Single IUP at [redacted]w[redacted]d Missed abortion Complete Spontaneous Abortion  PLAN Care turned over to Dr Garwin Brothers for disposition.    Hansel Feinstein CNM, MSN Certified Nurse-Midwife 09/06/2022  7:18 AM

## 2022-09-06 NOTE — Progress Notes (Signed)
Dr. Garwin Brothers manually removing POC.  Pt tolerating procedure well.

## 2022-09-06 NOTE — Progress Notes (Signed)
U/S tech notified RN that pt may have passed POC in toilet prior to U/S.  This RN removed a large piece of placenta from toilet and placed in specimen container.

## 2022-09-06 NOTE — Progress Notes (Signed)
Dr. Garwin Brothers called unit to check pt status.  MS informed pt passed portion of placenta prior to U/S and now awaiting U/S results.

## 2022-09-06 NOTE — MAU Provider Note (Signed)
Chart & hx reviewed. Erin Lester' CNM note of events prior to my arrival reviewed and acknowledged. Called by RN regarding patient's arrival with miscarriage in progress. Pt could be heard screaming in the background. Permission given to Erin Lester to try to remove fetus which was noted at the os and most likely cause of pain , On arrival fetus had passed . Pt c/o something in vagina. On pad medium amount of blood noted. Nothing seen at introitus. Bimanual exam done> placenta tissue with clot removed from vagina and added to fetal specimen. To radiology for assessment of uterine cavity. Pt is scheduled for suction D&E u/s guided at [facility] today. If tissue remains , then will have pt be done there. Advised pt and husband  of plan of care. Await sonogram

## 2022-09-06 NOTE — MAU Note (Signed)
Erin Lester is a 37 y.o. at [redacted]w[redacted]d here in MAU reporting: by EMS reporting miscarriage. Pt screaming in pain. EMS reports 281mcg of Fentanyl given en route  Onset of complaint: 09/06/2022 Pain score: 10/10 lower abdomen  Vitals:   09/06/22 0620  BP: (!) 145/78  Pulse: (!) 105  Resp: 20  Temp: 98.7 F (37.1 C)  SpO2: 99%     FHT: N/A Lab orders placed from triage:

## 2022-09-06 NOTE — MAU Note (Signed)
Reviewed ultrasound report  c/w SAB. Pt apparently passed additional tissue in ultrasound. Spoke with pt's husband . Surgery cancelled today. SAB precautions reviewed. F/u 2 wk

## 2022-09-09 LAB — SURGICAL PATHOLOGY

## 2022-11-19 ENCOUNTER — Other Ambulatory Visit: Payer: Self-pay | Admitting: Obstetrics and Gynecology

## 2022-11-19 DIAGNOSIS — Z8759 Personal history of other complications of pregnancy, childbirth and the puerperium: Secondary | ICD-10-CM

## 2022-11-29 ENCOUNTER — Other Ambulatory Visit: Payer: No Typology Code available for payment source

## 2022-11-29 ENCOUNTER — Ambulatory Visit: Payer: No Typology Code available for payment source

## 2022-12-04 ENCOUNTER — Encounter: Payer: Self-pay | Admitting: *Deleted

## 2022-12-04 NOTE — Progress Notes (Deleted)
103.8kg

## 2022-12-05 ENCOUNTER — Other Ambulatory Visit: Payer: No Typology Code available for payment source

## 2022-12-05 ENCOUNTER — Telehealth: Payer: Self-pay | Admitting: *Deleted

## 2022-12-05 ENCOUNTER — Ambulatory Visit: Payer: No Typology Code available for payment source

## 2022-12-05 NOTE — Telephone Encounter (Signed)
Phoned pt explaining the reason for canceling her detail u/s and her Dr. Norlene Campbell for today. Informed her of the opportunity to reschedule her Dr.s consult either July 2nd or 9th, and she can ask for [facility] when she calls back to reschedule.

## 2022-12-23 ENCOUNTER — Encounter: Payer: Self-pay | Admitting: *Deleted

## 2022-12-24 ENCOUNTER — Ambulatory Visit: Payer: No Typology Code available for payment source

## 2022-12-24 ENCOUNTER — Ambulatory Visit: Payer: No Typology Code available for payment source | Admitting: *Deleted

## 2022-12-24 ENCOUNTER — Ambulatory Visit
Payer: No Typology Code available for payment source | Attending: Obstetrics and Gynecology | Admitting: Maternal & Fetal Medicine

## 2022-12-24 DIAGNOSIS — Z3169 Encounter for other general counseling and advice on procreation: Secondary | ICD-10-CM | POA: Insufficient documentation

## 2022-12-24 DIAGNOSIS — O4592 Premature separation of placenta, unspecified, second trimester: Secondary | ICD-10-CM

## 2022-12-24 DIAGNOSIS — Z8759 Personal history of other complications of pregnancy, childbirth and the puerperium: Secondary | ICD-10-CM | POA: Diagnosis not present

## 2022-12-24 NOTE — Progress Notes (Signed)
Preconception appt: Bp: 123 74 , Pulse 83

## 2022-12-25 NOTE — Progress Notes (Signed)
MFM Preconception consultation.   Erin Lester is a 37 yo G2P1 who is here for preconception visit at the request of Dr. Maxie Better, MD regarding prior history of placental abruption.  Erin Lester is doing well today. She reports that she had a recent miscarriage this spring at 11 weeks. She noted a normal overall pregnancy but began to bleed and have contraction at 10+ weeks and ultimately delivered within the 11th week. The pathology was not available at this time.  Prior to that in 2017 she presented to L&D with new onset contractions which noted a subchorionic hemorrhage and then retroplacental bleed suggesting placental abruption.  She was delivered emergently which with inoperative diagnosis of placental abruption was noted. Her child suffered neurologic insult, whom is present today and wheelchair bound. She has no additional risk factors such as chronic hypertension or drug use.   OB History  Gravida Para Term Preterm AB Living  2 1   1   1   SAB IAB Ectopic Multiple Live Births        0 1    # Outcome Date GA Lbr Len/2nd Weight Sex Delivery Anes PTL Lv  2 Gravida           1 Preterm 12/16/15 [redacted]w[redacted]d  2 lb 0.1 oz (0.91 kg) M CS-LTranv Gen  LIV   Past Medical History:  Diagnosis Date   Anemia    Constipation    Eczema    History of placenta abruption 11/2015   preterm 27 weeks c/s delivery per ultrasound placenta abruption   Intermittent palpitations    09-05-2022  per pt had work-up approx 4 yrs ago included echo and event monitor told everything normal   Irregular menses    Low iron    Missed ab 09/04/2022   Myalgia 01/20/2019   Seasonal asthma    09-05-2022  per pt last used rescue inhaler 2 yrs ago   Sickle cell trait ([facility])    Wears glasses    Past Surgical History:  Procedure Laterality Date   BREAST REDUCTION SURGERY  06/08/2012   Procedure: MAMMARY REDUCTION  (BREAST);  Surgeon: Louisa Second, MD;  Location: [facility];  Service:  Plastics;  Laterality: Bilateral;   CESAREAN SECTION N/A 12/16/2015   Procedure: CESAREAN SECTION;  Surgeon: Marcelle Overlie, MD;  Location[facility];  Service: Obstetrics;  Laterality: N/A;  STAT C Section    Family History  Problem Relation Age of Onset   Asthma Mother    Eczema Father    Allergic rhinitis Father    Eczema Sister    Allergic rhinitis Sister    Eczema Brother    Allergic rhinitis Brother    Social History   Socioeconomic History   Marital status: Married    Spouse name: Not on file   Number of children: Not on file   Years of education: Not on file   Highest education level: Not on file  Occupational History   Not on file  Tobacco Use   Smoking status: Never   Smokeless tobacco: Never  Vaping Use   Vaping Use: Never used  Substance and Sexual Activity   Alcohol use: Not Currently    Comment: seldom   Drug use: Never   Sexual activity: Not Currently    Birth control/protection: Coitus interruptus  Other Topics Concern   Not on file  Social History Narrative   Not on file   Social Determinants of Health   Financial Resource Strain: Not  on file  Food Insecurity: Not on file  Transportation Needs: Not on file  Physical Activity: Not on file  Stress: Not on file  Social Connections: Not on file  Intimate Partner Violence: Not on file      Current Outpatient Medications (Respiratory):    albuterol (VENTOLIN HFA) 108 (90 Base) MCG/ACT inhaler, Inhale 1-2 puffs into the lungs every 6 (six) hours as needed for wheezing or shortness of breath.  Current Outpatient Medications (Analgesics):    ibuprofen (ADVIL) 800 MG tablet, Take 1 tablet (800 mg total) by mouth every 8 (eight) hours as needed.   Allergies  Allergen Reactions   Other Itching    Per pt apples/ banana/ oranges cause her gums to itch and throb   Latex Rash   Soap Itching    Floral/Fruity Soap    Impression/Counseling:  I discussed with Erin Lester that given her  prior history of preterm placental abruption and late first trimester loss, antiphospholipid antibody syndrome should be evaluated. These labs are drawn today.  I discussed the 1% recurrence risk for placental abruption without a known cause.  If APLAS is diagnosed, which I would treat if any of her labs were abnormal with lovenox and aspirin. We discussed that the true diagnosis is clinical + abnormal  lab >12 weeks apart.   If APLAS is not diagnosed and she pursued pregnancy at minimum there is value in starting low dose ASA which I would recommend taking two.   If her labs are normal I would pursue pregnancy when she is ready.  In addition, baseline Glucose and TSH should be performed. There is minimal value in assessment of her uterus with sonohystogram. Lastly, Genetic screening for maternal karyotype may be warranted.   However, at this time she is in agreement with performing he APLAS antibody test for now and pursing pregnancy based on the result of these labs.  I spent 45 minutes with direct consultation and medical record review.  All questions answered.  Novella Olive, MD

## 2022-12-26 LAB — CARDIOLIPIN ANTIBODIES, IGM+IGG
Anticardiolipin IgG: <9 GPL U/mL (ref 0–14)
Anticardiolipin IgM: <9 MPL U/mL (ref 0–12)

## 2022-12-26 LAB — LUPUS ANTICOAGULANT PANEL
Dilute Viper Venom Time: 26.9 s (ref 0.0–47.0)
PTT Lupus Anticoagulant: 32.5 s (ref 0.0–43.5)

## 2022-12-26 LAB — BETA-2-GLYCOPROTEIN I ABS, IGG/M/A
Beta-2 Glyco 1 IgA: <9 GPI IgA units (ref 0–25)
Beta-2 Glyco 1 IgM: <9 GPI IgM units (ref 0–32)
Beta-2 Glyco I IgG: <9 GPI IgG units (ref 0–20)

## 2023-04-17 ENCOUNTER — Other Ambulatory Visit: Payer: Self-pay | Admitting: Obstetrics and Gynecology

## 2023-04-17 ENCOUNTER — Encounter: Payer: Self-pay | Admitting: Obstetrics and Gynecology

## 2023-04-17 DIAGNOSIS — N644 Mastodynia: Secondary | ICD-10-CM

## 2023-05-14 ENCOUNTER — Other Ambulatory Visit: Payer: No Typology Code available for payment source

## 2023-08-17 ENCOUNTER — Emergency Department (HOSPITAL_COMMUNITY)
Admission: EM | Admit: 2023-08-17 | Discharge: 2023-08-17 | Disposition: A | Discharge status: Home / Self Care | Payer: No Typology Code available for payment source | Attending: Emergency Medicine | Admitting: Emergency Medicine

## 2023-08-17 ENCOUNTER — Encounter (HOSPITAL_COMMUNITY): Payer: Self-pay | Admitting: *Deleted

## 2023-08-17 ENCOUNTER — Emergency Department (HOSPITAL_COMMUNITY): Payer: No Typology Code available for payment source

## 2023-08-17 ENCOUNTER — Other Ambulatory Visit: Payer: Self-pay

## 2023-08-17 DIAGNOSIS — R000 Tachycardia, unspecified: Secondary | ICD-10-CM | POA: Insufficient documentation

## 2023-08-17 DIAGNOSIS — R059 Cough, unspecified: Secondary | ICD-10-CM | POA: Diagnosis not present

## 2023-08-17 DIAGNOSIS — R0789 Other chest pain: Secondary | ICD-10-CM | POA: Insufficient documentation

## 2023-08-17 DIAGNOSIS — Z91040 Latex allergy status: Secondary | ICD-10-CM | POA: Insufficient documentation

## 2023-08-17 HISTORY — DX: Anxiety disorder, unspecified: F41.9

## 2023-08-17 LAB — CBC
HCT: 36.6 % (ref 36.0–46.0)
Hemoglobin: 11.4 g/dL — ABNORMAL LOW (ref 12.0–15.0)
MCH: 22.3 pg — ABNORMAL LOW (ref 26.0–34.0)
MCHC: 31.1 g/dL (ref 30.0–36.0)
MCV: 71.5 fL — ABNORMAL LOW (ref 80.0–100.0)
Platelets: 366 10*3/uL (ref 150–400)
RBC: 5.12 MIL/uL — ABNORMAL HIGH (ref 3.87–5.11)
RDW: 14.7 % (ref 11.5–15.5)
WBC: 7.3 10*3/uL (ref 4.0–10.5)
nRBC: 0 % (ref 0.0–0.2)

## 2023-08-17 LAB — RESP PANEL BY RT-PCR (RSV, FLU A&B, COVID)  RVPGX2
Influenza A by PCR: NEGATIVE
Influenza B by PCR: NEGATIVE
Resp Syncytial Virus by PCR: NEGATIVE
SARS Coronavirus 2 by RT PCR: NEGATIVE

## 2023-08-17 LAB — BASIC METABOLIC PANEL
Anion gap: 11 (ref 5–15)
BUN: 10 mg/dL (ref 6–20)
CO2: 22 mmol/L (ref 22–32)
Calcium: 9.5 mg/dL (ref 8.9–10.3)
Chloride: 102 mmol/L (ref 98–111)
Creatinine, Ser: 0.87 mg/dL (ref 0.44–1.00)
GFR, Estimated: >60 mL/min (ref >60)
Glucose, Bld: 89 mg/dL (ref 70–99)
Potassium: 3.9 mmol/L (ref 3.5–5.1)
Sodium: 135 mmol/L (ref 135–145)

## 2023-08-17 LAB — HCG, QUANTITATIVE, PREGNANCY: hCG, Beta Chain, Quant, S: <1 m[IU]/mL (ref <5)

## 2023-08-17 LAB — TROPONIN I (HIGH SENSITIVITY): Troponin I (High Sensitivity): <2 ng/L (ref <18)

## 2023-08-17 LAB — D-DIMER, QUANTITATIVE: D-Dimer, Quant: 0.41 ug{FEU}/mL (ref 0.00–0.50)

## 2023-08-17 MED ORDER — IBUPROFEN 400 MG PO TABS
600.0000 mg | ORAL_TABLET | Freq: Once | ORAL | Status: AC
  Administered 2023-08-17: 600 mg via ORAL
  Filled 2023-08-17: qty 1

## 2023-08-17 NOTE — ED Triage Notes (Signed)
 The pt is c/I chest tightness since last pm  sl cough   low temp  lmp feb 6th the pt reports pain through to her posterior chest no meds taken for the pain

## 2023-08-17 NOTE — ED Provider Triage Note (Signed)
 Emergency Medicine Provider Triage Evaluation Note  Erin Lester , a 38 y.o. female  was evaluated in triage.  Pt complains of chest pain.  Review of Systems  Positive:  Negative:   Physical Exam  BP 130/89   Pulse (!) 120   Temp 99.7 F (37.6 C)   Resp 18   Ht 5\' 2"  (1.575 m)   Wt 102.5 kg   LMP 07/31/2023   SpO2 99%   BMI 41.33 kg/m  Gen:   Awake, no distress   Resp:  Normal effort  MSK:   Moves extremities without difficulty  Other:    Medical Decision Making  Medically screening exam initiated at 4:26 PM.  Appropriate orders placed.  MIRZA KIDNEY was informed that the remainder of the evaluation will be completed by another provider, this initial triage assessment does not replace that evaluation, and the importance of remaining in the ED until their evaluation is complete.  Chest pain, slight cough since last night. Denies nausea, vomiting, diarrhea. Denies rhinorrhea or congestion. Denies SOB.  Denies recent surgery/immobilization, hx DVT/PE, hemoptysis, hx cancer in the past 6 months, calf swelling/tenderness. Does not smoke TOB.not on birth control.    Dorthy Cooler, [place] 08/17/23 [phone]

## 2023-08-17 NOTE — ED Provider Notes (Signed)
  EMERGENCY DEPARTMENT AT [facility] Provider Note   CSN: [id number] Arrival date & time: 08/17/23  1540     History  Chief Complaint  Patient presents with   Chest Pain    Erin Lester is a 38 y.o. female.  HPI 38 year old female with no significant past medical history presents with chest tightness.  Started last night and has been continuous ever since.  It is spreading laterally across her chest.  Sometimes laying flat on her side makes a little worse but otherwise there are no specific modifying factors.  No exertional component.  No shortness of breath.  She has a slight cough this morning and her temperature was 99.7 on arrival but no fevers at home.  No leg swelling, abdominal pain.  Pain is about a 5 out of 10.  She has not take anything for the pain.  Home Medications Prior to Admission medications   Medication Sig Start Date End Date Taking? Authorizing Provider  albuterol (VENTOLIN HFA) 108 (90 Base) MCG/ACT inhaler Inhale 1-2 puffs into the lungs every 6 (six) hours as needed for wheezing or shortness of breath.    [provider]  ibuprofen (ADVIL) 800 MG tablet Take 1 tablet (800 mg total) by mouth every 8 (eight) hours as needed. 09/06/22   Maxie Better, MD      Allergies    Other, Latex, and Soap    Review of Systems   Review of Systems  Constitutional:  Negative for fever.  Respiratory:  Positive for cough and chest tightness. Negative for shortness of breath.   Cardiovascular:  Negative for leg swelling.  Gastrointestinal:  Negative for abdominal pain.    Physical Exam Updated Vital Signs BP (!) 121/97 (BP Location: Right Arm)   Pulse (!) 109   Temp 100.1 F (37.8 C) (Oral)   Resp 18   Ht 5\' 2"  (1.575 m)   Wt 102.5 kg   LMP 07/31/2023   SpO2 100%   BMI 41.33 kg/m  Physical Exam Vitals and nursing note reviewed.  Constitutional:      General: She is not in acute distress.    Appearance: She is  well-developed. She is obese. She is not ill-appearing or diaphoretic.  HENT:     Head: Normocephalic and atraumatic.  Cardiovascular:     Rate and Rhythm: Regular rhythm. Tachycardia present.     Heart sounds: Normal heart sounds.     Comments: HR low 100s Pulmonary:     Effort: Pulmonary effort is normal.     Breath sounds: Normal breath sounds.  Abdominal:     Palpations: Abdomen is soft.     Tenderness: There is no abdominal tenderness.  Skin:    General: Skin is warm and dry.  Neurological:     Mental Status: She is alert.     ED Results / Procedures / Treatments   Labs (all labs ordered are listed, but only abnormal results are displayed) Labs Reviewed  CBC - Abnormal; Notable for the following components:      Result Value   RBC 5.12 (*)    Hemoglobin 11.4 (*)    MCV 71.5 (*)    MCH 22.3 (*)    All other components within normal limits  RESP PANEL BY RT-PCR (RSV, FLU A&B, COVID)  RVPGX2  BASIC METABOLIC PANEL  HCG, QUANTITATIVE, PREGNANCY  D-DIMER, QUANTITATIVE (NOT AT [facility])  TROPONIN I (HIGH SENSITIVITY)    EKG EKG Interpretation Date/Time:  Sunday August 17 2023 16:28:25 EST Ventricular Rate:  115 PR Interval:  132 QRS Duration:  60 QT Interval:  302 QTC Calculation: 417 R Axis:   54  Text Interpretation: Sinus tachycardia Nonspecific T wave abnormality  rate is faster, otherwise T wave changes similar to 2019 Confirmed by Pricilla Loveless [phone]) on 08/17/2023 6:00:44 PM  Radiology DG Chest 2 View Result Date: 08/17/2023 CLINICAL DATA:  Chest pain EXAM: CHEST - 2 VIEW COMPARISON:  10/17/2018 FINDINGS: Frontal and lateral views of the chest demonstrate an unremarkable cardiac silhouette. There is diffuse increased bronchovascular prominence, without focal consolidation, effusion, or pneumothorax. No acute bony abnormalities. IMPRESSION: 1. Bilateral increased bronchovascular prominence, compatible with reactive airway disease or bronchitis. No lobar  pneumonia. Electronically Signed   By: Sharlet Salina M.D.   On: 08/17/2023 17:15    Procedures Procedures    Medications Ordered in ED Medications  ibuprofen (ADVIL) tablet 600 mg (600 mg Oral Given 08/17/23 1849)    ED Course/ Medical Decision Making/ A&P                                 Medical Decision Making Amount and/or Complexity of Data Reviewed Labs:     Details: Normal troponin after many hours of symptoms, almost 1 day.  D-dimer negative. Radiology: independent interpretation performed.    Details: No pneumothorax ECG/medicine tests: independent interpretation performed.    Details: Tachycardia but no ischemia   Unclear cause of her atypical chest pain.  She was given some ibuprofen and feels a little better.  Her heart rate was elevated on arrival but has come down to around 90s/100 on my most recent exam.  Low suspicion for ACS, PE, dissection.  Possibly a mild URI causing some chest symptoms but her respiratory panel is negative for COVID/flu/RSV.  She declined fluids/IV medication.  She was given some ibuprofen and recommended to continue this.  Otherwise, appears well and is stable for discharge with outpatient follow-up and return precautions.        Final Clinical Impression(s) / ED Diagnoses Final diagnoses:  Chest tightness    Rx / DC Orders ED Discharge Orders     None         Pricilla Loveless, MD 08/17/23 2019

## 2023-08-17 NOTE — Discharge Instructions (Signed)
 If you develop recurrent, continued, or worsening chest pain, shortness of breath, fever, vomiting, abdominal or back pain, or any other new/concerning symptoms then return to the ER for evaluation.

## 2023-08-17 NOTE — ED Notes (Signed)
Patient given turkey sandwich and something to drink.  

## 2023-08-25 ENCOUNTER — Emergency Department (HOSPITAL_COMMUNITY)
Admission: EM | Admit: 2023-08-25 | Discharge: 2023-08-25 | Discharge status: Against Medical Advice | Attending: Emergency Medicine | Admitting: Emergency Medicine

## 2023-08-25 ENCOUNTER — Emergency Department (HOSPITAL_COMMUNITY)

## 2023-08-25 ENCOUNTER — Encounter (HOSPITAL_COMMUNITY): Payer: Self-pay | Admitting: Emergency Medicine

## 2023-08-25 DIAGNOSIS — R079 Chest pain, unspecified: Secondary | ICD-10-CM | POA: Insufficient documentation

## 2023-08-25 DIAGNOSIS — Z5321 Procedure and treatment not carried out due to patient leaving prior to being seen by health care provider: Secondary | ICD-10-CM | POA: Diagnosis not present

## 2023-08-25 LAB — CBC
HCT: 35.2 % — ABNORMAL LOW (ref 36.0–46.0)
Hemoglobin: 10.8 g/dL — ABNORMAL LOW (ref 12.0–15.0)
MCH: 22.1 pg — ABNORMAL LOW (ref 26.0–34.0)
MCHC: 30.7 g/dL (ref 30.0–36.0)
MCV: 72.1 fL — ABNORMAL LOW (ref 80.0–100.0)
Platelets: 303 10*3/uL (ref 150–400)
RBC: 4.88 MIL/uL (ref 3.87–5.11)
RDW: 14.6 % (ref 11.5–15.5)
WBC: 6.6 10*3/uL (ref 4.0–10.5)
nRBC: 0 % (ref 0.0–0.2)

## 2023-08-25 LAB — BASIC METABOLIC PANEL
Anion gap: 8 (ref 5–15)
BUN: 9 mg/dL (ref 6–20)
CO2: 24 mmol/L (ref 22–32)
Calcium: 9.1 mg/dL (ref 8.9–10.3)
Chloride: 104 mmol/L (ref 98–111)
Creatinine, Ser: 0.89 mg/dL (ref 0.44–1.00)
GFR, Estimated: >60 mL/min (ref >60)
Glucose, Bld: 127 mg/dL — ABNORMAL HIGH (ref 70–99)
Potassium: 3.5 mmol/L (ref 3.5–5.1)
Sodium: 136 mmol/L (ref 135–145)

## 2023-08-25 LAB — TROPONIN I (HIGH SENSITIVITY)
Troponin I (High Sensitivity): 3 ng/L (ref <18)
Troponin I (High Sensitivity): <2 ng/L (ref <18)

## 2023-08-25 NOTE — ED Triage Notes (Signed)
 Pt here from home with c/o chest pain no sob or n/v . Was here 2 weeks ago for same , has appointment tomorrow with her md

## 2023-12-30 ENCOUNTER — Ambulatory Visit
Admission: RE | Admit: 2023-12-30 | Discharge: 2023-12-30 | Disposition: A | Discharge status: Home / Self Care | Source: Ambulatory Visit | Attending: Obstetrics and Gynecology | Admitting: Obstetrics and Gynecology

## 2023-12-30 DIAGNOSIS — N644 Mastodynia: Secondary | ICD-10-CM
